# Patient Record
Sex: Female | Born: 1995 | Race: Black or African American | Hispanic: No | Marital: Single | Smoking: Never smoker
Health system: Southern US, Community
[De-identification: ages and names within clinical notes are randomized; demographics above are authoritative.]

## PROBLEM LIST (undated history)

## (undated) DIAGNOSIS — A749 Chlamydial infection, unspecified: Secondary | ICD-10-CM

## (undated) DIAGNOSIS — Z789 Other specified health status: Secondary | ICD-10-CM

## (undated) DIAGNOSIS — G43909 Migraine, unspecified, not intractable, without status migrainosus: Secondary | ICD-10-CM

## (undated) HISTORY — PX: NO PAST SURGERIES: SHX2092

## (undated) HISTORY — DX: Chlamydial infection, unspecified: A74.9

## (undated) HISTORY — DX: Migraine, unspecified, not intractable, without status migrainosus: G43.909

## (undated) HISTORY — PX: REFRACTIVE SURGERY: SHX103

---
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
## 2014-01-03 ENCOUNTER — Ambulatory Visit (INDEPENDENT_AMBULATORY_CARE_PROVIDER_SITE_OTHER): Payer: BC Managed Care – PPO | Admitting: Advanced Practice Midwife

## 2014-01-03 ENCOUNTER — Encounter: Payer: Self-pay | Admitting: Advanced Practice Midwife

## 2014-01-03 VITALS — BP 110/76 | Ht 63.0 in | Wt 104.0 lb

## 2014-01-03 DIAGNOSIS — Z3200 Encounter for pregnancy test, result unknown: Secondary | ICD-10-CM

## 2014-01-03 DIAGNOSIS — Z30017 Encounter for initial prescription of implantable subdermal contraceptive: Secondary | ICD-10-CM | POA: Insufficient documentation

## 2014-01-03 DIAGNOSIS — Z3202 Encounter for pregnancy test, result negative: Secondary | ICD-10-CM

## 2014-01-03 DIAGNOSIS — Z3049 Encounter for surveillance of other contraceptives: Secondary | ICD-10-CM

## 2014-01-03 DIAGNOSIS — Z3046 Encounter for surveillance of implantable subdermal contraceptive: Secondary | ICD-10-CM

## 2014-01-03 LAB — POCT URINE PREGNANCY: Preg Test, Ur: NEGATIVE

## 2014-01-03 NOTE — Progress Notes (Signed)
Karen Moss 18 y.o. Filed Vitals:   01/03/14 1126  BP: 110/76    Past Medical History  Diagnosis Date  . Medical history non-contributory     Family History  Problem Relation Age of Onset  . Hypertension Mother   . Stroke Mother   . Heart disease Mother   . Heart disease Father   . Cancer Maternal Grandmother     lung  . Cancer Maternal Grandfather     brain    History   Social History  . Marital Status: Single    Spouse Name: N/A    Number of Children: N/A  . Years of Education: N/A   Occupational History  . Not on file.   Social History Main Topics  . Smoking status: Never Smoker   . Smokeless tobacco: Never Used  . Alcohol Use: No  . Drug Use: No  . Sexual Activity: Yes    Birth Control/ Protection: Implant   Other Topics Concern  . Not on file   Social History Narrative  . No narrative on file    HPI:  Karen Moss is here for Nexplanon removal and reinsertion.  She was given informed consent for removal of her Nexplanon and reinsertion of a new one.A signed copy is in the chart.  Appropriate time out taken. Nexlanon site (left arm) identified and thea area was prepped in usual sterile fashon. 2 cc of 1% lidocaine was used to anesthetize the area starting with the distal end of the implant. A small stab incision was made right beside the implant on the distal portion.  The Nexplanon rod was grasped using hemostats and removed without difficulty.  There was less than 3 cc blood loss. There were no complications. Next, the area was cleansed again and the new Nexplanon was inserted without difficulty.  Steri strips and a pressure bandage were applied.  Pt was instructed to remove pressure bandage in a few hours, and keep insertion site covered with a bandaid for 3 days.  Follow-up scheduled PRN problems  CRESENZO-DISHMAN,Angelize Ryce 01/03/2014 11:31 AM

## 2014-02-11 ENCOUNTER — Encounter (HOSPITAL_COMMUNITY): Payer: Self-pay | Admitting: *Deleted

## 2014-02-11 ENCOUNTER — Emergency Department (HOSPITAL_COMMUNITY)
Admission: EM | Admit: 2014-02-11 | Discharge: 2014-02-11 | Disposition: A | Discharge status: Home / Self Care | Payer: BC Managed Care – PPO | Attending: Emergency Medicine | Admitting: Emergency Medicine

## 2014-02-11 DIAGNOSIS — R1110 Vomiting, unspecified: Secondary | ICD-10-CM | POA: Diagnosis not present

## 2014-02-11 DIAGNOSIS — R21 Rash and other nonspecific skin eruption: Secondary | ICD-10-CM | POA: Insufficient documentation

## 2014-02-11 DIAGNOSIS — Z79899 Other long term (current) drug therapy: Secondary | ICD-10-CM | POA: Insufficient documentation

## 2014-02-11 DIAGNOSIS — J029 Acute pharyngitis, unspecified: Secondary | ICD-10-CM | POA: Diagnosis present

## 2014-02-11 MED ORDER — AMOXICILLIN 500 MG PO CAPS: 500.0000 mg | ORAL_CAPSULE | Freq: Three times a day (TID) | ORAL | Status: DC

## 2014-02-11 MED ORDER — AMOXICILLIN 250 MG PO CAPS
500.0000 mg | ORAL_CAPSULE | Freq: Once | ORAL | Status: AC
Start: 2014-02-11 — End: 2014-02-11
  Administered 2014-02-11: 500 mg via ORAL
  Filled 2014-02-11: qty 2

## 2014-02-11 MED ORDER — HYDROCODONE-ACETAMINOPHEN 5-325 MG PO TABS: 1.0000 | ORAL_TABLET | ORAL | Status: DC | PRN

## 2014-02-11 MED ORDER — IBUPROFEN 800 MG PO TABS
800.0000 mg | ORAL_TABLET | Freq: Once | ORAL | Status: AC
  Administered 2014-02-11: 800 mg via ORAL
  Filled 2014-02-11: qty 1

## 2014-02-11 MED ORDER — IBUPROFEN 600 MG PO TABS: 600.0000 mg | ORAL_TABLET | Freq: Four times a day (QID) | ORAL | Status: DC | PRN

## 2014-02-11 NOTE — ED Notes (Signed)
Pt states she has had a sore throat since this morning, has had a rash since last week and her pharmacist told her she may have scabies, so she recommend benadryl for her. Pt also vomited x 1 just PTA.

## 2014-02-11 NOTE — ED Provider Notes (Signed)
CSN: [id number]     Arrival date & time 02/11/14  0025 History   First MD Initiated Contact with Patient 02/11/14 0033     No chief complaint on file.    (Consider location/radiation/quality/duration/timing/severity/associated sxs/prior Treatment) Patient is a 18 y.o. female presenting with pharyngitis. The history is provided by the patient.  Sore Throat This is a new problem. The current episode started yesterday. The problem occurs intermittently. The problem has been gradually worsening. Associated symptoms include a rash, a sore throat and vomiting. Pertinent negatives include no abdominal pain, arthralgias, chest pain, coughing or neck pain. The symptoms are aggravated by swallowing. She has tried acetaminophen for the symptoms. The treatment provided no relief.    Past Medical History  Diagnosis Date  . Medical history non-contributory    Past Surgical History  Procedure Laterality Date  . No past surgeries     Family History  Problem Relation Age of Onset  . Hypertension Mother   . Stroke Mother   . Heart disease Mother   . Heart disease Father   . Cancer Maternal Grandmother     lung  . Cancer Maternal Grandfather     brain   History  Substance Use Topics  . Smoking status: Never Smoker   . Smokeless tobacco: Never Used  . Alcohol Use: No   OB History    Gravida Para Term Preterm AB TAB SAB Ectopic Multiple Living   0              Review of Systems  Constitutional: Negative for activity change.       All ROS Neg except as noted in HPI  HENT: Positive for sore throat. Negative for nosebleeds.   Eyes: Negative for photophobia and discharge.  Respiratory: Negative for cough, shortness of breath and wheezing.   Cardiovascular: Negative for chest pain and palpitations.  Gastrointestinal: Positive for vomiting. Negative for abdominal pain and blood in stool.  Genitourinary: Negative for dysuria, frequency and hematuria.  Musculoskeletal: Negative for back pain,  arthralgias and neck pain.  Skin: Positive for rash.  Neurological: Negative for dizziness, seizures and speech difficulty.  Psychiatric/Behavioral: Negative for hallucinations and confusion.      Allergies  Tangerine flavor  Home Medications   Prior to Admission medications   Medication Sig Start Date End Date Taking? Authorizing Provider  citalopram (CELEXA) 10 MG tablet Take 1 tablet (10 mg total) by mouth daily. 08/24/13   Madison Hickman, NP  etonogestrel (IMPLANON) 68 MG IMPL implant Inject 1 each into the skin once.    Historical Provider, MD  hydrOXYzine (ATARAX/VISTARIL) 50 MG tablet Take 1 tablet (50 mg total) by mouth at bedtime as needed for anxiety (sleep/anxiety). 08/24/13   Madison Hickman, NP   There were no vitals taken for this visit. Physical Exam  Constitutional: She is oriented to person, place, and time. She appears well-developed and well-nourished.  Non-toxic appearance.  HENT:  Head: Normocephalic.  Right Ear: Tympanic membrane and external ear normal.  Left Ear: Tympanic membrane and external ear normal.  Mouth/Throat: Uvula is midline. Uvula swelling present. Posterior oropharyngeal erythema present. No tonsillar abscesses.  Eyes: EOM and lids are normal. Pupils are equal, round, and reactive to light.  Neck: Normal range of motion. Neck supple. Carotid bruit is not present.  Cardiovascular: Normal rate, regular rhythm, normal heart sounds, intact distal pulses and normal pulses.   Pulmonary/Chest: Breath sounds normal. No respiratory distress.  Abdominal: Soft. Bowel sounds are normal. There is no  tenderness. There is no guarding.  Musculoskeletal: Normal range of motion.  Lymphadenopathy:       Head (right side): No submandibular adenopathy present.       Head (left side): No submandibular adenopathy present.    She has no cervical adenopathy.  Neurological: She is alert and oriented to person, place, and time. She has normal strength. No cranial  nerve deficit or sensory deficit.  Skin: Skin is warm and dry.  Psychiatric: She has a normal mood and affect. Her speech is normal.  Nursing note and vitals reviewed.   ED Course  Procedures (including critical care time) Labs Review Labs Reviewed - No data to display  Imaging Review No results found.   EKG Interpretation None      MDM Temp 100.1, vital signs otherwise wnl. Rx for amoxil and ibuprofen given to the patient. Rx for norco given to the patient.   Final diagnoses:  None    **I have reviewed nursing notes, vital signs, and all appropriate lab and imaging results for this patient.Lenox Ahr, PA-C 12/87/86 [id number]  Delora Fuel, MD 09/47/09 [id number]

## 2014-02-11 NOTE — Discharge Instructions (Signed)

## 2015-03-09 ENCOUNTER — Other Ambulatory Visit: Payer: Self-pay | Admitting: Obstetrics and Gynecology

## 2015-03-14 ENCOUNTER — Other Ambulatory Visit: Payer: Self-pay | Admitting: *Deleted

## 2015-03-24 IMAGING — US US CHEST/MEDIASTINUM
1 series · 14 of 16 positions shown · non-contrast
Comparison: None.

CLINICAL DATA: Palpable nodule mid back.

CHEST ULTRASOUND

[Series 1: us chest/mediastinum · 0.04mm/px · 14 of 21 slices shown]
[im 1/21]
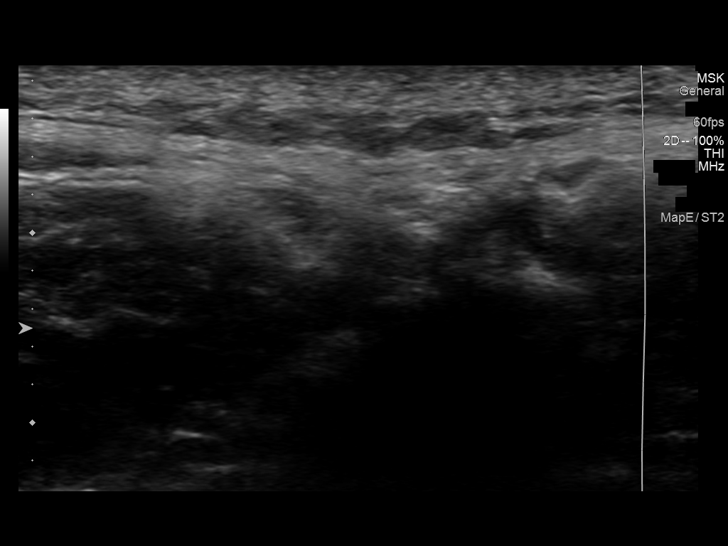
[im 2/21]
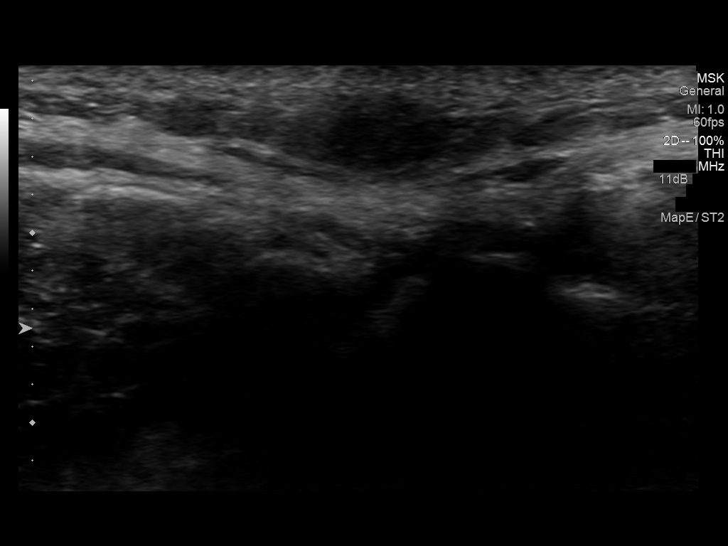
[im 3/21]
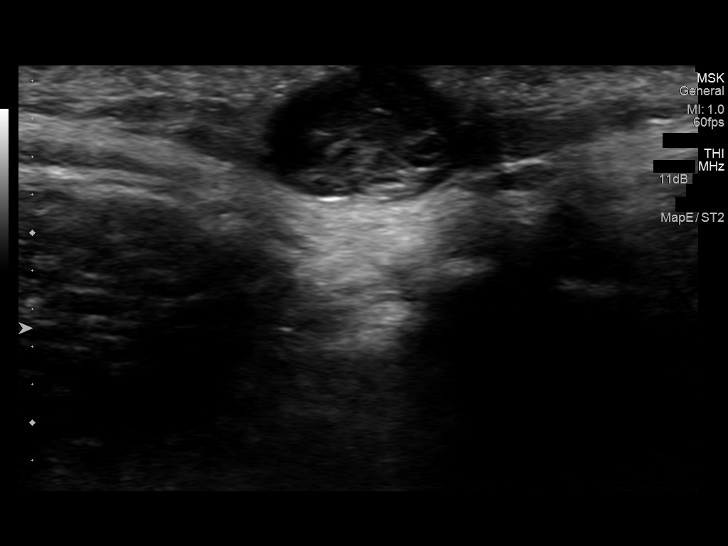
[im 6/21]
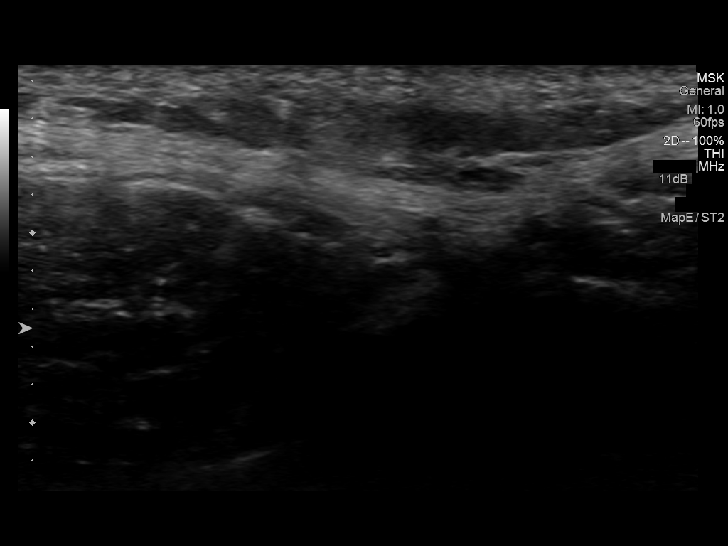
[im 7/21]
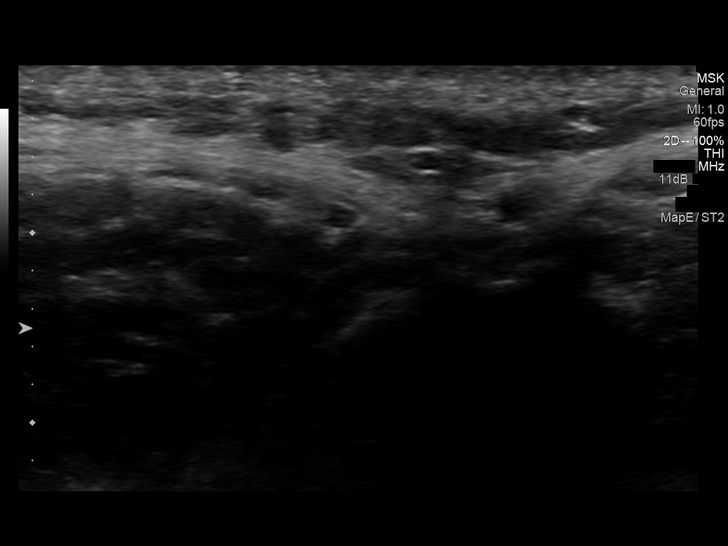
[im 9/21]
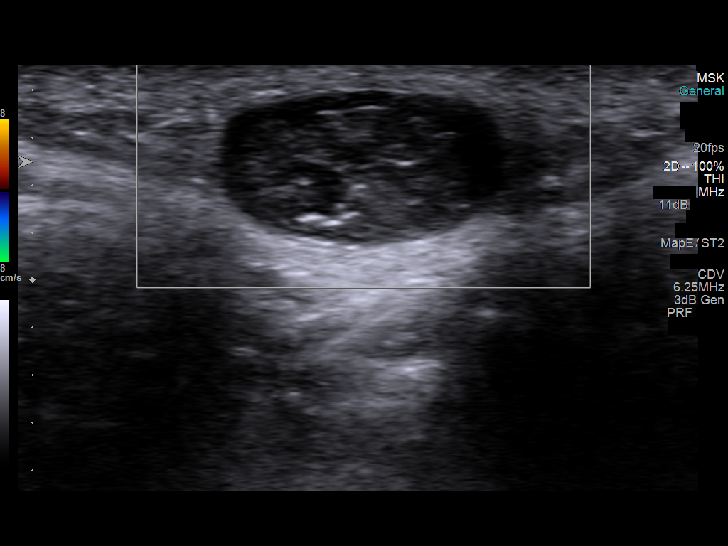
[im 10/21]
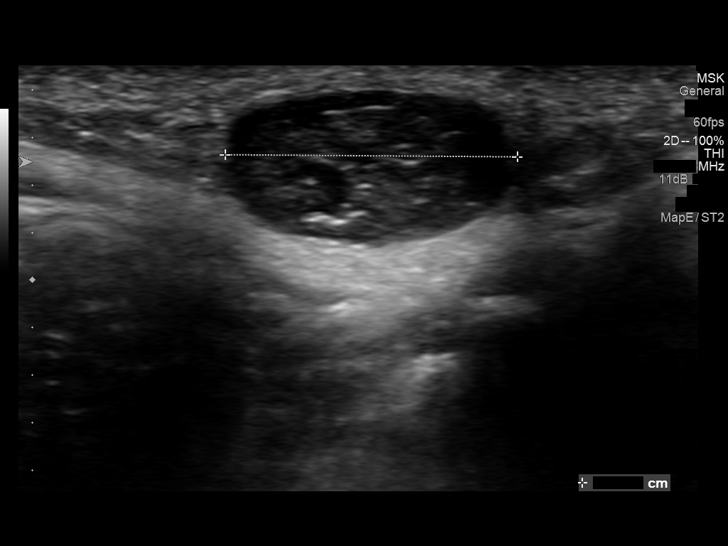
[im 11/21]
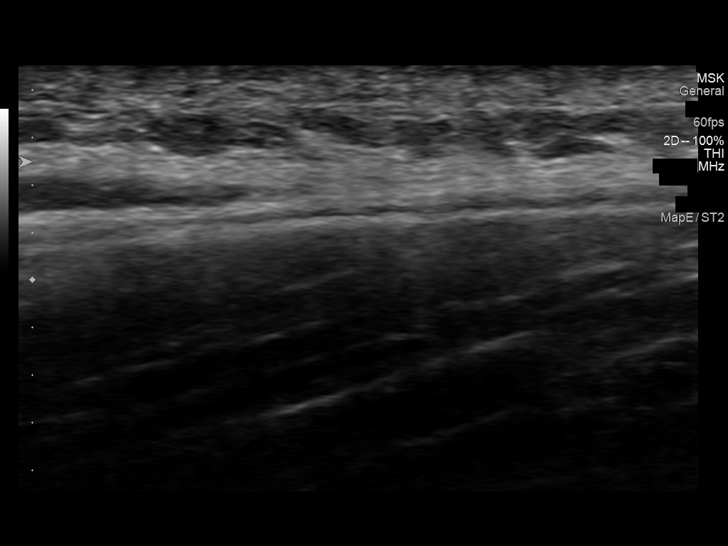
[im 13/21]
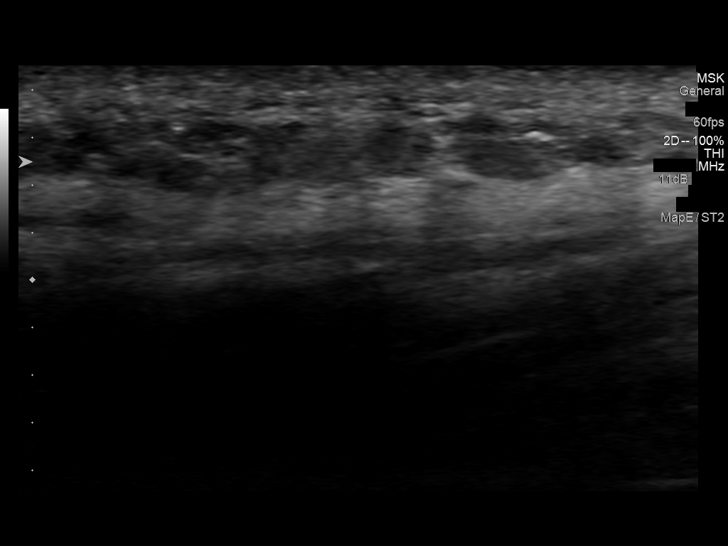
[im 14/21]
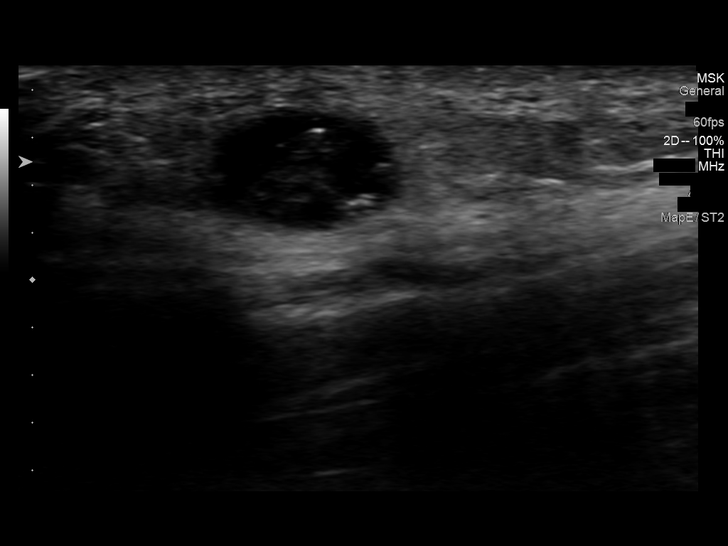
[im 17/21]
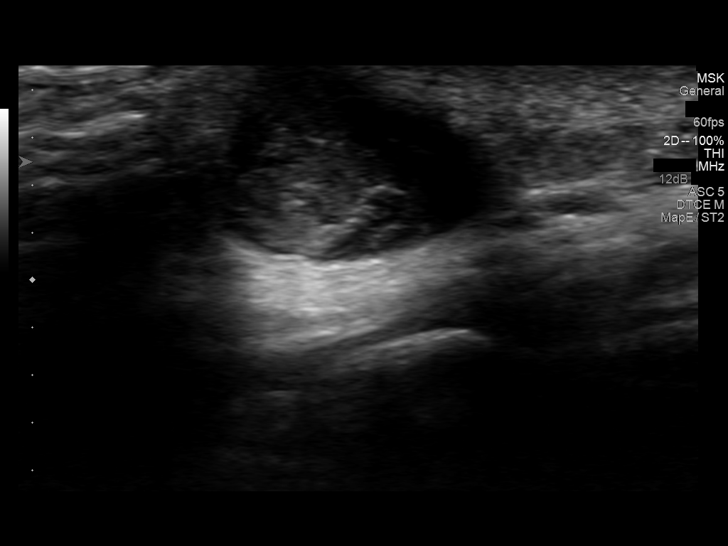
[im 18/21]
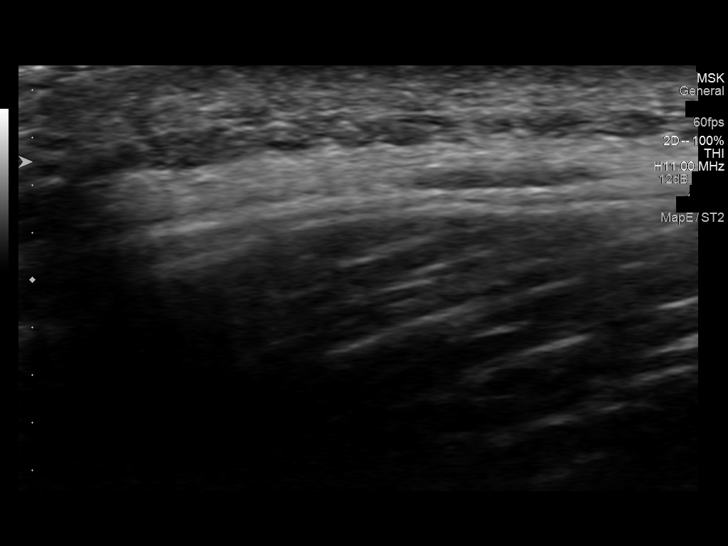
[im 19/21]
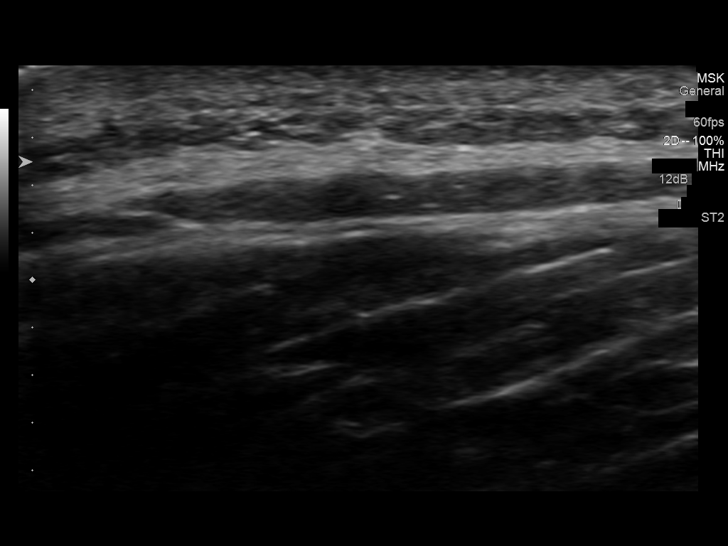
[im 21/21]
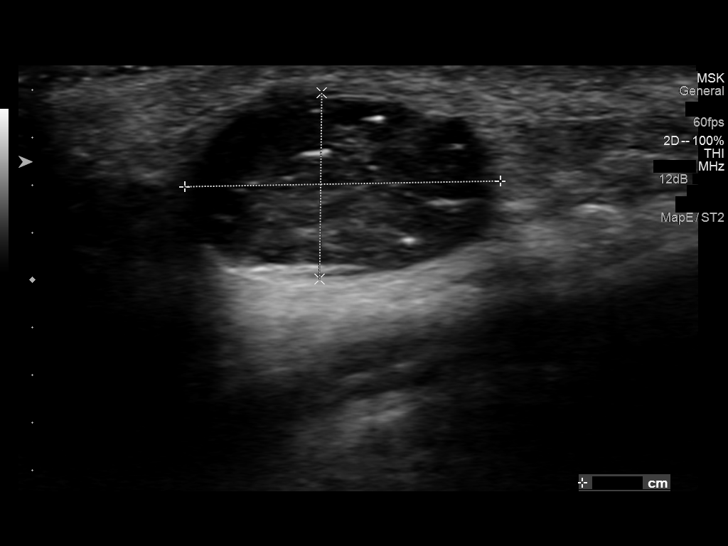

[14 of 16 positions shown; findings below may reference images not displayed]

FINDINGS: Corresponding with the palpable concern reported to be in
the mid central back is a well-circumscribed superficial 1.3 x
x 1.2 cm "mass."  This demonstrates low-level central echoes, but
no internal blood flow with color Doppler.
IMPRESSION: Palpable concern corresponds with a well-circumscribed complex
lesion superficially.  This demonstrates no blood flow and may
reflect a sebaceous cyst. Clinical follow-up recommended.  If there
is clinical concern of this being related to the spine, further
evaluation with MRI should be considered.

## 2015-04-19 ENCOUNTER — Other Ambulatory Visit: Payer: Self-pay | Admitting: Obstetrics and Gynecology

## 2015-04-20 ENCOUNTER — Telehealth: Payer: Self-pay | Admitting: *Deleted

## 2015-04-20 NOTE — Telephone Encounter (Signed)
Pt aware of rx being refilled

## 2016-01-21 ENCOUNTER — Other Ambulatory Visit: Payer: Self-pay | Admitting: Obstetrics & Gynecology

## 2016-11-03 ENCOUNTER — Other Ambulatory Visit: Payer: Self-pay | Admitting: Obstetrics and Gynecology

## 2016-12-18 ENCOUNTER — Other Ambulatory Visit: Payer: Self-pay | Admitting: Advanced Practice Midwife

## 2016-12-23 ENCOUNTER — Encounter: Payer: Self-pay | Admitting: Adult Health

## 2016-12-23 ENCOUNTER — Other Ambulatory Visit (HOSPITAL_COMMUNITY)
Admission: RE | Admit: 2016-12-23 | Discharge: 2016-12-23 | Disposition: A | Discharge status: Home / Self Care | Payer: Self-pay | Source: Ambulatory Visit | Attending: Adult Health | Admitting: Adult Health

## 2016-12-23 ENCOUNTER — Ambulatory Visit (INDEPENDENT_AMBULATORY_CARE_PROVIDER_SITE_OTHER): Payer: Medicaid Other | Admitting: Adult Health

## 2016-12-23 VITALS — BP 100/80 | HR 67 | Ht 61.5 in | Wt 113.0 lb

## 2016-12-23 DIAGNOSIS — Z3009 Encounter for other general counseling and advice on contraception: Secondary | ICD-10-CM

## 2016-12-23 DIAGNOSIS — Z01419 Encounter for gynecological examination (general) (routine) without abnormal findings: Secondary | ICD-10-CM

## 2016-12-23 DIAGNOSIS — Z309 Encounter for contraceptive management, unspecified: Secondary | ICD-10-CM

## 2016-12-23 NOTE — Patient Instructions (Signed)
No sex  Nexplanon 10/23 Physical in 1 year

## 2016-12-23 NOTE — Progress Notes (Signed)
Patient ID: Karen Moss, female   DOB: [birth year], 21 y.o.   MRN: [id number] History of Present Illness:  Karen Moss is a 21 year old black female in for a well woman gyn exam and her first pap.She is working 2 jobs and going to college.   Current Medications, Allergies, Past Medical History, Past Surgical History, Family History and Social History were reviewed in [employer] record.     Review of Systems: Patient denies any headaches, hearing loss, fatigue, blurred vision, shortness of breath, chest pain, abdominal pain, problems with bowel movements, urination, or intercourse. No joint pain or mood swings.Has occasional irregular bleeding with nexplanon.    Physical Exam:BP 100/80 (BP Location: Right Arm, Patient Position: Sitting, Cuff Size: Normal)   Pulse 67   Ht 5' 1.5" (1.562 m)   Wt 113 lb (51.3 kg)   BMI 21.01 kg/m  General:  Well developed, well nourished, no acute distress Skin:  Warm and dry Neck:  Midline trachea, normal thyroid, good ROM, no lymphadenopathy Lungs; Clear to auscultation bilaterally Breast:  No dominant palpable mass, retraction, or nipple discharge,bilateral nipple rods Cardiovascular: Regular rate and rhythm Abdomen:  Soft, non tender, no hepatosplenomegaly,navel pierced  Pelvic:  External genitalia is normal in appearance, no lesions.  The vagina is normal in appearance. Urethra has no lesions or masses. The cervix is smooth. Uterus is felt to be normal size, shape, and contour.  No adnexal masses or tenderness noted.Bladder is non tender, no masses felt. Extremities/musculoskeletal:  No swelling or varicosities noted, no clubbing or cyanosis Psych:  No mood changes, alert and cooperative,seems happy PHQ 2 score 0.  Impression: 1. Encounter for gynecological examination with Papanicolaou smear of cervix   2. Family planning       Plan:  Check HIV and RPR No sex Return 10/23 for nexplanon removal and  reinsertion Physical in 1 year, pap in 3 if normal

## 2016-12-24 LAB — HIV ANTIBODY (ROUTINE TESTING W REFLEX): HIV SCREEN 4TH GENERATION: NONREACTIVE

## 2016-12-24 LAB — RPR: RPR Ser Ql: NONREACTIVE

## 2016-12-26 LAB — CYTOLOGY - PAP
Chlamydia: POSITIVE — AB
Diagnosis: NEGATIVE
Neisseria Gonorrhea: NEGATIVE

## 2016-12-30 ENCOUNTER — Encounter: Payer: Self-pay | Admitting: Adult Health

## 2016-12-30 ENCOUNTER — Telehealth: Payer: Self-pay | Admitting: Adult Health

## 2016-12-30 DIAGNOSIS — A749 Chlamydial infection, unspecified: Secondary | ICD-10-CM

## 2016-12-30 HISTORY — DX: Chlamydial infection, unspecified: A74.9

## 2016-12-30 MED ORDER — AZITHROMYCIN 500 MG PO TABS
ORAL_TABLET | ORAL | 0 refills | Status: DC
Start: 2016-12-30 — End: 2017-01-06

## 2016-12-30 NOTE — Telephone Encounter (Signed)
Pt aware +chlamydia on pap. Rx azithromycin 500 mg # 2 2 po now.No sex. POC 11/14 at 10 am. [facility] sent.Partner to clinic to can treat if desired.

## 2016-12-31 ENCOUNTER — Telehealth: Payer: Self-pay | Admitting: Adult Health

## 2016-12-31 NOTE — Telephone Encounter (Signed)
Pt called and wants her partner Albina Billet [birth year] treated, will send azithromycin 500 mg #2 2 po now to [facility] in [place]

## 2017-01-06 ENCOUNTER — Encounter: Payer: Self-pay | Admitting: Advanced Practice Midwife

## 2017-01-06 ENCOUNTER — Encounter: Payer: Self-pay | Admitting: Adult Health

## 2017-01-06 ENCOUNTER — Ambulatory Visit (INDEPENDENT_AMBULATORY_CARE_PROVIDER_SITE_OTHER): Payer: Medicaid Other | Admitting: Adult Health

## 2017-01-06 VITALS — BP 112/64 | HR 77 | Ht 61.0 in | Wt 118.0 lb

## 2017-01-06 DIAGNOSIS — Z3202 Encounter for pregnancy test, result negative: Secondary | ICD-10-CM | POA: Diagnosis not present

## 2017-01-06 DIAGNOSIS — Z3049 Encounter for surveillance of other contraceptives: Secondary | ICD-10-CM

## 2017-01-06 DIAGNOSIS — Z30017 Encounter for initial prescription of implantable subdermal contraceptive: Secondary | ICD-10-CM

## 2017-01-06 DIAGNOSIS — Z3046 Encounter for surveillance of implantable subdermal contraceptive: Secondary | ICD-10-CM

## 2017-01-06 LAB — POCT URINE PREGNANCY: Preg Test, Ur: NEGATIVE

## 2017-01-06 MED ORDER — ETONOGESTREL 68 MG ~~LOC~~ IMPL
68.0000 mg | DRUG_IMPLANT | Freq: Once | SUBCUTANEOUS | Status: AC
Start: 2017-01-06 — End: 2017-01-06
  Administered 2017-01-06: 68 mg via SUBCUTANEOUS

## 2017-01-06 NOTE — Progress Notes (Signed)
Subjective:     Patient ID: Karen Moss, female   DOB: 13-Mar-1996, 21 y.o.   MRN: [id number]  HPI Karen Moss is a 21 years old black female in for nexplanon removal and reinsertion.  Review of Systems For nexplanon removal and reinsertion Reviewed past medical,surgical, social and family history. Reviewed medications and allergies.     Objective:   Physical Exam BP 112/64 (BP Location: Right Arm, Patient Position: Sitting, Cuff Size: Normal)   Pulse 77   Ht 5\' 1"  (1.549 m)   Wt 118 lb (53.5 kg)   BMI 22.30 kg/m UPT negative.consent signed, time out called,Left arm cleansed with betadine, and injected with 1.5 cc 2% lidocaine and waited til numb.Under sterile technique a #11 blade was used to make small vertical incision, and a curved forceps was used to easily remove rod. Then nexplanon reinserted and easily palpated, by provider and pt, steri strips applied then pressure dressing applied.     Assessment:     1. Encounter for Nexplanon removal   2. Nexplanon insertion   3. Pregnancy examination or test, negative result      lot # [id number] exp 3/21  Plan:     Use condoms x 2 weeks, keep clean and dry x 24 hours, no heavy lifting, keep steri strips on x 72 hours, Keep pressure dressing on x 24 hours. Follow up prn problems.

## 2017-01-06 NOTE — Addendum Note (Signed)
Addended by: Gaylyn Rong A on: 01/06/2017 02:17 PM   Modules accepted: Orders

## 2017-01-06 NOTE — Patient Instructions (Signed)
Use condoms x 2 weeks, keep clean and dry x 24 hours, no heavy lifting, keep steri strips on x 72 hours, Keep pressure dressing on x 24 hours. Follow up prn problems.  

## 2017-01-28 ENCOUNTER — Encounter: Payer: Self-pay | Admitting: Adult Health

## 2017-01-28 ENCOUNTER — Ambulatory Visit (INDEPENDENT_AMBULATORY_CARE_PROVIDER_SITE_OTHER): Payer: Medicaid Other | Admitting: Adult Health

## 2017-01-28 VITALS — BP 126/80 | HR 65 | Ht 62.0 in | Wt 113.0 lb

## 2017-01-28 DIAGNOSIS — Z8619 Personal history of other infectious and parasitic diseases: Secondary | ICD-10-CM | POA: Diagnosis not present

## 2017-01-28 DIAGNOSIS — Z0189 Encounter for other specified special examinations: Secondary | ICD-10-CM

## 2017-01-28 NOTE — Progress Notes (Signed)
Subjective:     Patient ID: Karen Moss, female   DOB: 1995/10/12, 21 y.o.   MRN: [id number]  HPI Karen Moss is a 21 year old black female in for proof of cure for recent +chlamyida on pap.  Review of Systems  Has not had sex, since +CHL  Reviewed past medical,surgical, social and family history. Reviewed medications and allergies.     Objective:   Physical Exam BP 126/80 (BP Location: Right Arm, Patient Position: Sitting, Cuff Size: Normal)   Pulse 65   Ht 5\' 2"  (1.575 m)   Wt 113 lb (51.3 kg)   LMP 01/24/2017   BMI 20.67 kg/m  Skin warm and dry.Pelvic: external genitalia is normal in appearance no lesions, vagina: pinkish tan blood in vault,urethra has no lesions or masses noted, cervix:smooth. GC/CHL obtained.     Assessment:     History of chlamydia, for proof of cure    Plan:     GC/CHL sent F/U prn

## 2017-01-30 LAB — GC/CHLAMYDIA PROBE AMP
CHLAMYDIA, DNA PROBE: NEGATIVE
NEISSERIA GONORRHOEAE BY PCR: NEGATIVE

## 2017-04-23 ENCOUNTER — Other Ambulatory Visit: Payer: Self-pay | Admitting: Obstetrics & Gynecology

## 2017-09-23 ENCOUNTER — Telehealth: Payer: Self-pay | Admitting: Adult Health

## 2017-09-23 MED ORDER — CITALOPRAM HYDROBROMIDE 20 MG PO TABS: 20.0000 mg | ORAL_TABLET | Freq: Every day | ORAL | 2 refills | Status: DC

## 2017-09-23 NOTE — Telephone Encounter (Signed)
Mailbox full, unable to leave VM.

## 2017-09-23 NOTE — Telephone Encounter (Signed)
Pt called requesting rx on celexa, was on years ago when seen at [facility] in [place] seen at [facility] yesterday and and started on trauma therapy and has appt to see psychiatrist tin August but her counselor told her to call us to see if can get back on celexa now.will rx celexa 20 mg 1 daily, #30 with 2 refills

## 2017-09-23 NOTE — Telephone Encounter (Signed)
Patient states she was going to [place] in [place] and is now going to [facility].  She went yesterday for her assessment but will not be seeing a doctor there until August 11.  Needs a refill on her Celexa. Informed patient that she may need a visit with [facility] since she has not been seen since 2018. Verbalized understanding.  Please advise.

## 2017-10-22 ENCOUNTER — Other Ambulatory Visit: Payer: Self-pay | Admitting: Obstetrics and Gynecology

## 2017-12-28 ENCOUNTER — Telehealth: Payer: Self-pay | Admitting: *Deleted

## 2017-12-28 NOTE — Telephone Encounter (Signed)
Patient states she has been seeing a psychiatrist who has prescribed her antidepressants which have caused her to have a decrease in appetite.  States she can goes days without eating and was told by her psychiatrist that the Megace could be increased to help with her appetite.  Can you please advise.

## 2017-12-28 NOTE — Telephone Encounter (Signed)
LMOVM returning patient's call.  

## 2018-02-16 ENCOUNTER — Encounter: Payer: Self-pay | Admitting: Adult Health

## 2018-02-16 ENCOUNTER — Ambulatory Visit (INDEPENDENT_AMBULATORY_CARE_PROVIDER_SITE_OTHER): Payer: Medicaid Other | Admitting: Adult Health

## 2018-02-16 VITALS — BP 121/71 | HR 74 | Ht 62.0 in | Wt 110.0 lb

## 2018-02-16 DIAGNOSIS — H6120 Impacted cerumen, unspecified ear: Secondary | ICD-10-CM

## 2018-02-16 DIAGNOSIS — Z309 Encounter for contraceptive management, unspecified: Secondary | ICD-10-CM | POA: Diagnosis not present

## 2018-02-16 DIAGNOSIS — Z01419 Encounter for gynecological examination (general) (routine) without abnormal findings: Secondary | ICD-10-CM

## 2018-02-16 DIAGNOSIS — Z113 Encounter for screening for infections with a predominantly sexual mode of transmission: Secondary | ICD-10-CM

## 2018-02-16 DIAGNOSIS — Z3009 Encounter for other general counseling and advice on contraception: Secondary | ICD-10-CM | POA: Insufficient documentation

## 2018-02-16 DIAGNOSIS — Z975 Presence of (intrauterine) contraceptive device: Secondary | ICD-10-CM | POA: Diagnosis not present

## 2018-02-16 HISTORY — DX: Impacted cerumen, unspecified ear: H61.20

## 2018-02-16 NOTE — Progress Notes (Signed)
Patient ID: Karen Moss, female   DOB: 03/28/1995, 22 y.o.   MRN: [id number] History of Present Illness: Karen Moss is a 22 year old black female in for well woman gyn exam, she had normal pap 12/23/16. 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  PCP is [employer] PA.   Current Medications, Allergies, Past Medical History, Past Surgical History, Family History and Social History were reviewed in [employer] record.     Review of Systems: Patient denies any headaches, hearing loss, fatigue, blurred vision, shortness of breath, chest pain, abdominal pain, problems with bowel movements, urination, or intercourse. No joint pain or mood swings. Ears feel full, popping esp on left.  Feels tender over nexplanon at times.  Occasional irregular bleeding  with nexplanon   Physical Exam:BP 121/71 (BP Location: Right Arm, Patient Position: Sitting, Cuff Size: Normal)   Pulse 74   Ht 5\' 2"  (1.575 m)   Wt 110 lb (49.9 kg)   BMI 20.12 kg/m  General:  Well developed, well nourished, no acute distress Skin:  Warm and dry,has bilateral ear wax Neck:  Midline trachea, normal thyroid, good ROM, no lymphadenopathy Lungs; Clear to auscultation bilaterally Breast:  No dominant palpable mass, retraction, or nipple discharge Cardiovascular: Regular rate and rhythm Abdomen:  Soft, non tender, no hepatosplenomegaly Pelvic:  External genitalia is normal in appearance, no lesions.  The vagina is normal in appearance. Urethra has no lesions or masses. The cervix is smooth and nulliparous, GC/CHL obtained.Marland Kitchen  Uterus is felt to be normal size, shape, and contour.  No adnexal masses or tenderness noted.Bladder is non tender, no masses felt. Extremities/musculoskeletal:  No swelling or varicosities noted, no clubbing or cyanosis. Nexplanon easily palpated and slightly tender, no redness or swelling.  Psych:  No mood changes, alert and cooperative,seems happy PHQ 2 score 0. Fall risk is  low. Examination chaperoned by Genia Hotter RN.  Impression: 1. Encounter for well woman exam with routine gynecological exam   2. Family planning   3. Screening examination for STD (sexually transmitted disease)   4. Nexplanon in place   5. Wax in ear       Plan: Use warm water or Debrox for ear wax, no Q tips GC/CHL sent Check HIV and RPR Physical in 1 year Pap in 2021

## 2018-02-17 LAB — HIV ANTIBODY (ROUTINE TESTING W REFLEX): HIV SCREEN 4TH GENERATION: NONREACTIVE

## 2018-02-17 LAB — RPR: RPR Ser Ql: NONREACTIVE

## 2018-02-18 LAB — GC/CHLAMYDIA PROBE AMP
CHLAMYDIA, DNA PROBE: NEGATIVE
Neisseria gonorrhoeae by PCR: NEGATIVE

## 2018-02-23 ENCOUNTER — Other Ambulatory Visit: Payer: Self-pay | Admitting: Obstetrics and Gynecology

## 2018-06-05 ENCOUNTER — Other Ambulatory Visit: Payer: Self-pay | Admitting: Obstetrics & Gynecology

## 2018-08-16 ENCOUNTER — Other Ambulatory Visit: Payer: Self-pay

## 2018-08-16 ENCOUNTER — Encounter (HOSPITAL_COMMUNITY): Payer: Self-pay | Admitting: *Deleted

## 2018-08-16 ENCOUNTER — Emergency Department (HOSPITAL_COMMUNITY)
Admission: EM | Admit: 2018-08-16 | Discharge: 2018-08-16 | Disposition: A | Discharge status: Home / Self Care | Payer: PRIVATE HEALTH INSURANCE | Attending: Emergency Medicine | Admitting: Emergency Medicine

## 2018-08-16 DIAGNOSIS — Z5320 Procedure and treatment not carried out because of patient's decision for unspecified reasons: Secondary | ICD-10-CM | POA: Insufficient documentation

## 2018-08-16 DIAGNOSIS — Y92239 Unspecified place in hospital as the place of occurrence of the external cause: Secondary | ICD-10-CM | POA: Insufficient documentation

## 2018-08-16 DIAGNOSIS — Z79899 Other long term (current) drug therapy: Secondary | ICD-10-CM | POA: Diagnosis not present

## 2018-08-16 DIAGNOSIS — Y990 Civilian activity done for income or pay: Secondary | ICD-10-CM | POA: Diagnosis not present

## 2018-08-16 DIAGNOSIS — Z77098 Contact with and (suspected) exposure to other hazardous, chiefly nonmedicinal, chemicals: Secondary | ICD-10-CM

## 2018-08-16 DIAGNOSIS — Y9389 Activity, other specified: Secondary | ICD-10-CM | POA: Diagnosis not present

## 2018-08-16 DIAGNOSIS — H5713 Ocular pain, bilateral: Secondary | ICD-10-CM | POA: Diagnosis present

## 2018-08-16 NOTE — ED Triage Notes (Signed)
PT requested to leave because [facility] visitors are limited and her mother can not visit. PA explained to PT the limits of visitors due to [place] -19.

## 2018-08-16 NOTE — ED Notes (Addendum)
Pt called out requesting the morgan lenses be removed from her eye. Pt stated "I want all care stopped and I am going home." This RN asked why she was leaving and pt stated "Ya'll won't let my mother back so I am leaving!" Explained that visitors are only allowed in certain circumstances per [facility] and pt replied "My floor allows visitors to visit with patients." This RN explained that visitors are allowed with altered mental status patients, minors, and those who are actively dying. Pt then replied "My supervisor stated my mother can visit with me in the ED and my floor allows all patients to have visitors." This RN left the room at this point and advised notification to her nurse and provider would be made.

## 2018-08-16 NOTE — ED Triage Notes (Signed)
PT reports the Peri -care  Splashed in her eye. Pt reports burning to face Rt/Lt.  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 Pt reports blurred vision.

## 2018-08-16 NOTE — ED Triage Notes (Signed)
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 Pt requested her Mother to come to room . Pt was informed [place] COVID -19  No visitor policy . Pt reported her supervisor told her her mother could come back. Pt informed of [facility] policy of no visitors.

## 2018-08-16 NOTE — ED Provider Notes (Signed)
[facility] EMERGENCY DEPARTMENT Provider Note   CSN: [id number] Arrival date & time: 08/16/18  2000    History   Chief Complaint Chief Complaint  Patient presents with  . Chemical Exposure    in eyes    HPI Karen Moss is a 23 y.o. female no significant past medical history, presenting to the emergency department with bilateral eye pain, worse in the right.  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  She states she had the peri-spray splashed in her eyes.  Her eyes are burning and she has some blurry vision, however she states most of that is due to the fact that she pulled her contacts out and now cannot see very well.  She irrigated her eyes with the eyewash station for a few minutes prior to coming down to the ED.  No other symptoms reported.     The history is provided by the patient.    Past Medical History:  Diagnosis Date  . Chlamydia 12/30/2016  . Medical history non-contributory     Patient Active Problem List   Diagnosis Date Noted  . Encounter for well woman exam with routine gynecological exam 02/16/2018  . Family planning 02/16/2018  . Screening examination for STD (sexually transmitted disease) 02/16/2018  . Nexplanon in place 02/16/2018  . Wax in ear 02/16/2018  . Chlamydia 12/30/2016  . Nexplanon insertion 01/03/2014  . MDD (major depressive disorder), single episode, moderate ([place]) 08/21/2013    Past Surgical History:  Procedure Laterality Date  . NO PAST SURGERIES       OB History    Gravida  0   Para      Term      Preterm      AB      Living        SAB      TAB      Ectopic      Multiple      Live Births               Home Medications    Prior to Admission medications   Medication Sig Start Date End Date Taking? Authorizing Provider  citalopram (CELEXA) 20 MG tablet Take 1 tablet (20 mg total) by mouth daily. 09/23/17   Estill Dooms, NP  etonogestrel (IMPLANON) 68 MG  IMPL implant Inject 1 each into the skin once.    [provider]  megestrol (MEGACE) 40 MG tablet TAKE 1 TABLET BY MOUTH ONCE DAILY OF  SPOTTING 02/23/18   Jonnie Kind, MD  traZODone (DESYREL) 100 MG tablet TAKE 1 TABLET BY MOUTH ONCE DAILY AFTER A MEAL 01/08/18   [provider]  valACYclovir (VALTREX) 1000 MG tablet Take 1 tablet by mouth once daily 06/07/18   Florian Buff, MD    Family History Family History  Problem Relation Age of Onset  . Hypertension Mother   . Stroke Mother   . Heart disease Father   . Cancer Father 66       prostate  . Cancer Maternal Grandmother        lung  . Cancer Maternal Grandfather        brain    Social History Social History   Tobacco Use  . Smoking status: Never Smoker  . Smokeless tobacco: Never Used  Substance Use Topics  . Alcohol use: Yes    Comment: occ  . Drug use: No     Allergies  Tangerine flavor   Review of Systems Review of Systems  Eyes: Positive for pain and visual disturbance.  All other systems reviewed and are negative.    Physical Exam Updated Vital Signs BP 126/71 (BP Location: Right Arm)   Pulse 79   Temp 98.4 F (36.9 C) (Oral)   Ht 5\' 1"  (1.549 m)   Wt 51.7 kg   LMP 08/16/2018   SpO2 100%   BMI 21.54 kg/m   Physical Exam Vitals signs and nursing note reviewed.  Constitutional:      Appearance: She is well-developed.  HENT:     Head: Normocephalic and atraumatic.  Eyes:     Comments: Conjunctiva is to injected bilaterally.  No swelling to the face or lids.  Cardiovascular:     Rate and Rhythm: Normal rate.  Pulmonary:     Effort: Pulmonary effort is normal.  Neurological:     Mental Status: She is alert.  Psychiatric:        Mood and Affect: Mood normal.        Behavior: Behavior normal.      ED Treatments / Results  Labs (all labs ordered are listed, but only abnormal results are displayed) Labs Reviewed - No data to display  EKG None  Radiology No  results found.  Procedures Procedures (including critical care time)  Medications Ordered in ED Medications - No data to display   Initial Impression / Assessment and Plan / ED Course  I have reviewed the triage vital signs and the nursing notes.  Pertinent labs & imaging results that were available during my care of the patient were reviewed by me and considered in my medical decision making (see chart for details).        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 In the ED, eyes were irrigated with a Morgan lens and this was in progress upon my initial evaluation. However after about 500 cc of irrigation, patient became upset with the current visitor policy in the setting of the current COVID-19 pandemic.  Nursing discussed with patient that her mother would be unable to come back into her room.  Is reported she became upset and requesting to leave AMA, removing the [facility] lens irrigation.  I was unable to fully examine this patient, and she declined further evaluation.  I discussed the risks of leaving [place] prior to full examination, to include permanent eye damage or vision loss.  Patient verbalized understanding of these risks and continues to request to leave the ED [place].  Dr. Vanita Panda made aware.  Final Clinical Impressions(s) / ED Diagnoses   Final diagnoses:  Chemical exposure of eye    ED Discharge Orders    None       , [place] N, PA-C 08/16/18 2116    Carmin Muskrat, MD 08/18/18 1642

## 2018-09-04 ENCOUNTER — Emergency Department (HOSPITAL_COMMUNITY)
Admission: EM | Admit: 2018-09-04 | Discharge: 2018-09-04 | Disposition: A | Discharge status: Home / Self Care | Payer: Medicaid Other | Attending: Emergency Medicine | Admitting: Emergency Medicine

## 2018-09-04 ENCOUNTER — Encounter (HOSPITAL_COMMUNITY): Payer: Self-pay | Admitting: Emergency Medicine

## 2018-09-04 ENCOUNTER — Other Ambulatory Visit: Payer: Self-pay

## 2018-09-04 DIAGNOSIS — J358 Other chronic diseases of tonsils and adenoids: Secondary | ICD-10-CM

## 2018-09-04 DIAGNOSIS — H9201 Otalgia, right ear: Secondary | ICD-10-CM | POA: Insufficient documentation

## 2018-09-04 DIAGNOSIS — Z79899 Other long term (current) drug therapy: Secondary | ICD-10-CM | POA: Insufficient documentation

## 2018-09-04 DIAGNOSIS — J029 Acute pharyngitis, unspecified: Secondary | ICD-10-CM | POA: Insufficient documentation

## 2018-09-04 LAB — GROUP A STREP BY PCR: Group A Strep by PCR: NOT DETECTED

## 2018-09-04 MED ORDER — ACETIC ACID 2 % OT SOLN: 4.0000 [drp] | Freq: Three times a day (TID) | OTIC | 0 refills | Status: DC | PRN

## 2018-09-04 MED ORDER — PHENOL 1.4 % MT LIQD: 2.0000 | OROMUCOSAL | 0 refills | Status: DC | PRN

## 2018-09-04 MED ORDER — DEXAMETHASONE SODIUM PHOSPHATE 10 MG/ML IJ SOLN
10.0000 mg | Freq: Once | INTRAMUSCULAR | Status: AC
  Administered 2018-09-04: 10 mg via INTRAMUSCULAR
  Filled 2018-09-04: qty 1

## 2018-09-04 NOTE — Discharge Instructions (Addendum)
Your strep test today was negative.  Apply eardrops as needed for ear pain up to 3 times daily.  Drink plenty water and get plenty of rest.  You can take 400 to 600 mg of ibuprofen every 6 hours as needed for pain.  If this medicine starts to wear off before the next dose, you can take 500-1,000 mg of Tylenol every 6 hours.  Use warm water salt gargles, warm teas, honey, throat lozenges for management of sore throat.  He can also use Chloraseptic spray to help numb the back of the throat for comfort.  Return to the emergency department if any concerning signs or symptoms develop such as difficulty breathing, drooling, voice change, or high fevers.  Follow-up with PCP if symptoms persist.

## 2018-09-04 NOTE — ED Triage Notes (Signed)
Pt c/o sore throat with white spots since Thursday. Denies fever.

## 2018-09-04 NOTE — ED Provider Notes (Signed)
[facility] EMERGENCY DEPARTMENT Provider Note   CSN: [id number] Arrival date & time: 09/04/18  1655    History   Chief Complaint Chief Complaint  Patient presents with  . Sore Throat    HPI Karen Moss is a 23 y.o. female presents for evaluation of acute onset, progressively worsening right ear pain and sore throat.  She reports that her symptoms began with right ear pain on Wednesday 4 days ago.  Shortly thereafter she began to feel soreness in her throat which worsens with swallowing.  Denies drooling or difficulty breathing.  No fever or nasal congestion.  Has tried warm water salt gargles without significant relief of symptoms. Looked in her throat and noticed a white spot on tonsils.      The history is provided by the patient.    Past Medical History:  Diagnosis Date  . Chlamydia 12/30/2016  . Medical history non-contributory     Patient Active Problem List   Diagnosis Date Noted  . Encounter for well woman exam with routine gynecological exam 02/16/2018  . Family planning 02/16/2018  . Screening examination for STD (sexually transmitted disease) 02/16/2018  . Nexplanon in place 02/16/2018  . Wax in ear 02/16/2018  . Chlamydia 12/30/2016  . Nexplanon insertion 01/03/2014  . MDD (major depressive disorder), single episode, moderate ([place]) 08/21/2013    Past Surgical History:  Procedure Laterality Date  . NO PAST SURGERIES       OB History    Gravida  0   Para      Term      Preterm      AB      Living        SAB      TAB      Ectopic      Multiple      Live Births               Home Medications    Prior to Admission medications   Medication Sig Start Date End Date Taking? Authorizing Provider  acetic acid 2 % otic solution Place 4 drops into the right ear 3 (three) times daily as needed (ear pain). 09/04/18   Nils Flack, Katriana Dortch A, PA-C  citalopram (CELEXA) 20 MG tablet Take 1 tablet (20 mg total) by mouth daily. 09/23/17   Estill Dooms, NP  etonogestrel (IMPLANON) 68 MG IMPL implant Inject 1 each into the skin once.    [provider]  megestrol (MEGACE) 40 MG tablet TAKE 1 TABLET BY MOUTH ONCE DAILY OF  SPOTTING 02/23/18   Jonnie Kind, MD  phenol (CHLORASEPTIC) 1.4 % LIQD Use as directed 2 sprays in the mouth or throat as needed for throat irritation / pain. 09/04/18   Solace Manwarren A, PA-C  traZODone (DESYREL) 100 MG tablet TAKE 1 TABLET BY MOUTH ONCE DAILY AFTER A MEAL 01/08/18   [provider]  valACYclovir (VALTREX) 1000 MG tablet Take 1 tablet by mouth once daily 06/07/18   Florian Buff, MD    Family History Family History  Problem Relation Age of Onset  . Hypertension Mother   . Stroke Mother   . Heart disease Father   . Cancer Father 16       prostate  . Cancer Maternal Grandmother        lung  . Cancer Maternal Grandfather        brain    Social History Social History   Tobacco Use  . Smoking  status: Never Smoker  . Smokeless tobacco: Never Used  Substance Use Topics  . Alcohol use: Yes    Comment: occ  . Drug use: No     Allergies   Tangerine flavor   Review of Systems Review of Systems  Constitutional: Negative for chills and fever.  HENT: Positive for ear pain and sore throat. Negative for ear discharge, trouble swallowing and voice change.   Respiratory: Negative for cough and shortness of breath.   Cardiovascular: Negative for chest pain.     Physical Exam Updated Vital Signs BP 129/81 (BP Location: Right Arm)   Pulse 89   Temp 98.2 F (36.8 C) (Oral)   Ht 5\' 2"  (1.575 m)   Wt 51.7 kg   LMP 08/16/2018   SpO2 99%   BMI 20.85 kg/m   Physical Exam Vitals signs and nursing note reviewed.  Constitutional:      General: She is not in acute distress.    Appearance: She is well-developed.     Comments: Resting comfortably in chair  HENT:     Head: Normocephalic and atraumatic.     Right Ear: A middle ear effusion is present. Tympanic  membrane is not erythematous.     Left Ear: Tympanic membrane and ear canal normal. No drainage.  No middle ear effusion. Tympanic membrane is not erythematous.     Ears:     Comments: No tenderness to palpation of the pinna.  No mastoid tenderness.  Mild pain on insertion of the speculum into the ear canal.  No drainage or swelling noted.    Mouth/Throat:     Mouth: Mucous membranes are moist.     Tonsils: 2+ on the right. 2+ on the left.     Comments: Mild tonsillar hypertrophy and erythema.  Uvula is midline.  Airways patent, patient tolerating secretions without difficulty.  She has a single tonsil stone on the superior pole of the right tonsil.  No sublingual abnormalities. Eyes:     General:        Right eye: No discharge.        Left eye: No discharge.     Conjunctiva/sclera: Conjunctivae normal.  Neck:     Musculoskeletal: Normal range of motion and neck supple.     Vascular: No JVD.     Trachea: No tracheal deviation.     Comments: Right anterior cervical lymphadenopathy Cardiovascular:     Rate and Rhythm: Normal rate.  Pulmonary:     Effort: Pulmonary effort is normal.  Abdominal:     General: Bowel sounds are normal. There is no distension.     Palpations: Abdomen is soft.  Lymphadenopathy:     Cervical: Cervical adenopathy present.  Skin:    General: Skin is warm and dry.     Findings: No erythema.  Neurological:     Mental Status: She is alert.  Psychiatric:        Behavior: Behavior normal.      ED Treatments / Results  Labs (all labs ordered are listed, but only abnormal results are displayed) Labs Reviewed  GROUP A STREP BY PCR    EKG None  Radiology No results found.  Procedures Procedures (including critical care time)  Medications Ordered in ED Medications  dexamethasone (DECADRON) injection 10 mg (10 mg Intramuscular Given 09/04/18 1754)     Initial Impression / Assessment and Plan / ED Course  I have reviewed the triage vital signs and  the nursing notes.  Pertinent labs &  imaging results that were available during my care of the patient were reviewed by me and considered in my medical decision making (see chart for details).        Patient with right ear pain and sore throat.  She is afebrile, vital signs are stable.  She is nontoxic in appearance.  Tolerating secretions without difficulty.  Airway appears patent.  No concern for Ludwig's angina, deep space neck infection, retropharyngeal abscess, or peritonsillar abscess.  Rapid strep test was negative.  She has a tonsil stone but no exudates on the tonsils.  Examination of the ear does not suggest otitis media, malignant otitis externa, or mastoiditis but she may have a mild otitis externa.  Given Decadron in the ED for sore throat.  Discussed symptomatic management at home.  Recommend follow-up with PCP if symptoms persist.  Discussed strict ED return precautions. Patient verbalized understanding of and agreement with plan and is safe for discharge home at this time.   Final Clinical Impressions(s) / ED Diagnoses   Final diagnoses:  Viral pharyngitis  Right ear pain  Tonsil stone    ED Discharge Orders         Ordered    phenol (CHLORASEPTIC) 1.4 % LIQD  As needed     09/04/18 1755    acetic acid 2 % otic solution  3 times daily PRN     09/04/18 1755           Renita Papa, PA-C 09/04/18 1800    Fredia Sorrow, MD 09/04/18 [phone]

## 2018-11-15 ENCOUNTER — Other Ambulatory Visit: Payer: Self-pay | Admitting: Obstetrics and Gynecology

## 2019-02-21 ENCOUNTER — Ambulatory Visit: Payer: Medicaid Other | Admitting: Obstetrics & Gynecology

## 2019-02-21 ENCOUNTER — Other Ambulatory Visit: Payer: Self-pay

## 2019-02-21 ENCOUNTER — Encounter: Payer: Self-pay | Admitting: Obstetrics & Gynecology

## 2019-02-21 ENCOUNTER — Other Ambulatory Visit (HOSPITAL_COMMUNITY)
Admission: RE | Admit: 2019-02-21 | Discharge: 2019-02-21 | Disposition: A | Discharge status: Home / Self Care | Payer: Medicaid Other | Source: Ambulatory Visit | Attending: Adult Health | Admitting: Adult Health

## 2019-02-21 VITALS — BP 111/74 | HR 69 | Ht 62.0 in | Wt 118.3 lb

## 2019-02-21 DIAGNOSIS — Z01419 Encounter for gynecological examination (general) (routine) without abnormal findings: Secondary | ICD-10-CM

## 2019-02-21 DIAGNOSIS — Z113 Encounter for screening for infections with a predominantly sexual mode of transmission: Secondary | ICD-10-CM

## 2019-02-21 DIAGNOSIS — A749 Chlamydial infection, unspecified: Secondary | ICD-10-CM | POA: Diagnosis not present

## 2019-02-21 DIAGNOSIS — Z01411 Encounter for gynecological examination (general) (routine) with abnormal findings: Secondary | ICD-10-CM | POA: Diagnosis not present

## 2019-02-21 NOTE — Progress Notes (Signed)
Subjective:     Karen Moss is a 23 y.o. female here for a routine exam.  Patient's last menstrual period was 02/15/2019. G0P0 Birth Control Method:  nexplanon Menstrual Calendar(currently): regular every 2-3 months Current complaints: prolonged bleeding .   Current acute medical issues:     Recent Gynecologic History Patient's last menstrual period was 02/15/2019. Last Pap: 2019,  normal Last mammogram: ,    Past Medical History:  Diagnosis Date  . Chlamydia 12/30/2016  . Medical history non-contributory     Past Surgical History:  Procedure Laterality Date  . NO PAST SURGERIES      OB History    Gravida  0   Para      Term      Preterm      AB      Living        SAB      TAB      Ectopic      Multiple      Live Births              Social History   Socioeconomic History  . Marital status: Single    Spouse name: Not on file  . Number of children: Not on file  . Years of education: Not on file  . Highest education level: Not on file  Occupational History  . Not on file  Social Needs  . Financial resource strain: Not on file  . Food insecurity    Worry: Not on file    Inability: Not on file  . Transportation needs    Medical: Not on file    Non-medical: Not on file  Tobacco Use  . Smoking status: Never Smoker  . Smokeless tobacco: Never Used  Substance and Sexual Activity  . Alcohol use: Yes    Comment: occ  . Drug use: No  . Sexual activity: Yes    Birth control/protection: Implant  Lifestyle  . Physical activity    Days per week: Not on file    Minutes per session: Not on file  . Stress: Not on file  Relationships  . Social Herbalist on phone: Not on file    Gets together: Not on file    Attends religious service: Not on file    Active member of club or organization: Not on file    Attends meetings of clubs or organizations: Not on file    Relationship status: Not on file  Other Topics Concern  . Not on  file  Social History Narrative  . Not on file    Family History  Problem Relation Age of Onset  . Hypertension Mother   . Stroke Mother   . Heart disease Father   . Cancer Father 48       prostate  . Cancer Maternal Grandmother        lung  . Cancer Maternal Grandfather        brain     Current Outpatient Medications:  .  citalopram (CELEXA) 20 MG tablet, Take 1 tablet (20 mg total) by mouth daily., Disp: 30 tablet, Rfl: 2 .  etonogestrel (IMPLANON) 68 MG IMPL implant, Inject 1 each into the skin once., Disp: , Rfl:  .  megestrol (MEGACE) 40 MG tablet, TAKE 1 TABLET BY MOUTH ONCE DAILY OF  SPOTTING, Disp: 30 tablet, Rfl: 0 .  phenol (CHLORASEPTIC) 1.4 % LIQD, Use as directed 2 sprays in the mouth or throat as needed  for throat irritation / pain., Disp: 29 mL, Rfl: 0 .  traZODone (DESYREL) 100 MG tablet, TAKE 1 TABLET BY MOUTH ONCE DAILY AFTER A MEAL, Disp: , Rfl: 3 .  valACYclovir (VALTREX) 1000 MG tablet, Take 1 tablet by mouth once daily, Disp: 30 tablet, Rfl: 2 .  acetic acid 2 % otic solution, Place 4 drops into the right ear 3 (three) times daily as needed (ear pain). (Patient not taking: Reported on 02/21/2019), Disp: 15 mL, Rfl: 0  Review of Systems  Review of Systems  Constitutional: Negative for fever, chills, weight loss, malaise/fatigue and diaphoresis.  HENT: Negative for hearing loss, ear pain, nosebleeds, congestion, sore throat, neck pain, tinnitus and ear discharge.   Eyes: Negative for blurred vision, double vision, photophobia, pain, discharge and redness.  Respiratory: Negative for cough, hemoptysis, sputum production, shortness of breath, wheezing and stridor.   Cardiovascular: Negative for chest pain, palpitations, orthopnea, claudication, leg swelling and PND.  Gastrointestinal: negative for abdominal pain. Negative for heartburn, nausea, vomiting, diarrhea, constipation, blood in stool and melena.  Genitourinary: Negative for dysuria, urgency, frequency,  hematuria and flank pain.  Musculoskeletal: Negative for myalgias, back pain, joint pain and falls.  Skin: Negative for itching and rash.  Neurological: Negative for dizziness, tingling, tremors, sensory change, speech change, focal weakness, seizures, loss of consciousness, weakness and headaches.  Endo/Heme/Allergies: Negative for environmental allergies and polydipsia. Does not bruise/bleed easily.  Psychiatric/Behavioral: Negative for depression, suicidal ideas, hallucinations, memory loss and substance abuse. The patient is not nervous/anxious and does not have insomnia.        Objective:  Blood pressure 111/74, pulse 69, height 5\' 2"  (1.575 m), weight 118 lb 4.8 oz (53.7 kg), last menstrual period 02/15/2019.   Physical Exam  Vitals reviewed. Constitutional: She is oriented to person, place, and time. She appears well-developed and well-nourished.  HENT:  Head: Normocephalic and atraumatic.        Right Ear: External ear normal.  Left Ear: External ear normal.  Nose: Nose normal.  Mouth/Throat: Oropharynx is clear and moist.  Eyes: Conjunctivae and EOM are normal. Pupils are equal, round, and reactive to light. Right eye exhibits no discharge. Left eye exhibits no discharge. No scleral icterus.  Neck: Normal range of motion. Neck supple. No tracheal deviation present. No thyromegaly present.  Cardiovascular: Normal rate, regular rhythm, normal heart sounds and intact distal pulses.  Exam reveals no gallop and no friction rub.   No murmur heard. Respiratory: Effort normal and breath sounds normal. No respiratory distress. She has no wheezes. She has no rales. She exhibits no tenderness.  GI: Soft. Bowel sounds are normal. She exhibits no distension and no mass. There is no tenderness. There is no rebound and no guarding.  Genitourinary:  Breasts no masses skin changes or nipple changes bilaterally      Vulva is normal without lesions Vagina is pink moist without discharge Cervix  normal in appearance and pap is not done Uterus is normal size shape and contour Adnexa is negative with normal sized ovaries   Musculoskeletal: Normal range of motion. She exhibits no edema and no tenderness.  Neurological: She is alert and oriented to person, place, and time. She has normal reflexes. She displays normal reflexes. No cranial nerve deficit. She exhibits normal muscle tone. Coordination normal.  Skin: Skin is warm and dry. No rash noted. No erythema. No pallor.  Psychiatric: She has a normal mood and affect. Her behavior is normal. Judgment and thought content normal.  Medications Ordered at today's visit: No orders of the defined types were placed in this encounter.   Other orders placed at today's visit: No orders of the defined types were placed in this encounter.     Assessment:    Healthy female exam.    Plan:    Contraception: Nexplanon. Follow up in: 1 year.     No follow-ups on file.

## 2019-02-23 LAB — CERVICOVAGINAL ANCILLARY ONLY
Chlamydia: POSITIVE — AB
Comment: NEGATIVE
Comment: NORMAL
Neisseria Gonorrhea: NEGATIVE

## 2019-02-24 ENCOUNTER — Telehealth: Payer: Self-pay | Admitting: Obstetrics & Gynecology

## 2019-02-24 ENCOUNTER — Telehealth: Payer: Self-pay | Admitting: *Deleted

## 2019-02-24 ENCOUNTER — Encounter: Payer: Self-pay | Admitting: *Deleted

## 2019-02-24 MED ORDER — AZITHROMYCIN 500 MG PO TABS: 1000.0000 mg | ORAL_TABLET | Freq: Once | ORAL | 1 refills | Status: AC

## 2019-02-24 NOTE — Telephone Encounter (Signed)
LMOM for patient to return my call regarding test results.  I have also sent her a message in [place].

## 2019-03-21 ENCOUNTER — Other Ambulatory Visit: Payer: Self-pay

## 2019-03-24 ENCOUNTER — Other Ambulatory Visit: Payer: Medicaid Other

## 2019-03-24 ENCOUNTER — Other Ambulatory Visit (HOSPITAL_COMMUNITY)
Admission: RE | Admit: 2019-03-24 | Discharge: 2019-03-24 | Disposition: A | Discharge status: Home / Self Care | Payer: Medicaid Other | Source: Ambulatory Visit | Attending: Obstetrics & Gynecology | Admitting: Obstetrics & Gynecology

## 2019-03-24 ENCOUNTER — Other Ambulatory Visit: Payer: Self-pay

## 2019-03-24 DIAGNOSIS — Z113 Encounter for screening for infections with a predominantly sexual mode of transmission: Secondary | ICD-10-CM | POA: Diagnosis present

## 2019-03-24 NOTE — Progress Notes (Signed)
   NURSE VISIT- VAGINITIS/STD/POC  SUBJECTIVE:  Karen Moss is a 24 y.o. G0P0 GYN patientfemale here for a vaginal swab for proof of cure after treatment for Chlamydia.   There were no vitals taken for this visit.  Appears well, in no apparent distress  ASSESSMENT: Vaginal swab for proof of cure chlamydia  PLAN: Self-collected vaginal probe for Chlamydia sent to lab Treatment: to be determined once results are received Follow-up as needed if symptoms persist/worsen, or new symptoms develop  Ladonna Snide  03/24/2019 2:51 PM

## 2019-03-28 LAB — CERVICOVAGINAL ANCILLARY ONLY
Chlamydia: NEGATIVE
Comment: NEGATIVE
Comment: NORMAL
Neisseria Gonorrhea: NEGATIVE

## 2019-04-11 ENCOUNTER — Other Ambulatory Visit: Payer: Self-pay | Admitting: Obstetrics and Gynecology

## 2019-04-11 NOTE — Telephone Encounter (Signed)
Rx for Megace for BTB on Nexplanon refilled.

## 2019-04-13 ENCOUNTER — Other Ambulatory Visit: Payer: Medicaid Other | Admitting: Adult Health

## 2019-06-25 ENCOUNTER — Other Ambulatory Visit: Payer: Self-pay | Admitting: Obstetrics & Gynecology

## 2019-10-10 ENCOUNTER — Other Ambulatory Visit: Payer: Self-pay | Admitting: Obstetrics and Gynecology

## 2019-10-12 ENCOUNTER — Other Ambulatory Visit: Payer: Self-pay | Admitting: Obstetrics and Gynecology

## 2020-01-09 ENCOUNTER — Encounter: Payer: Medicaid Other | Admitting: Adult Health

## 2020-01-11 ENCOUNTER — Other Ambulatory Visit: Payer: Self-pay

## 2020-01-11 ENCOUNTER — Encounter: Payer: Self-pay | Admitting: Adult Health

## 2020-01-11 ENCOUNTER — Ambulatory Visit (INDEPENDENT_AMBULATORY_CARE_PROVIDER_SITE_OTHER): Payer: Medicaid Other | Admitting: Adult Health

## 2020-01-11 VITALS — BP 118/77 | HR 72 | Ht 62.0 in | Wt 114.0 lb

## 2020-01-11 DIAGNOSIS — Z3046 Encounter for surveillance of implantable subdermal contraceptive: Secondary | ICD-10-CM

## 2020-01-11 DIAGNOSIS — Z3202 Encounter for pregnancy test, result negative: Secondary | ICD-10-CM | POA: Diagnosis not present

## 2020-01-11 LAB — POCT URINE PREGNANCY: Preg Test, Ur: NEGATIVE

## 2020-01-11 MED ORDER — ETONOGESTREL 68 MG ~~LOC~~ IMPL
68.0000 mg | DRUG_IMPLANT | Freq: Once | SUBCUTANEOUS | Status: AC
  Administered 2020-01-11: 68 mg via SUBCUTANEOUS

## 2020-01-11 NOTE — Progress Notes (Signed)
  Subjective:     Patient ID: Karen Moss, female   DOB: Oct 20, 1995, 24 y.o.   MRN: [id number]  HPI Karen Moss is a 24 year old black female,single, G0P0, in for nexplanon removal and reinsertion. PCP is [employer] PA.   Review of Systems For nexplanon removal and reinsertion Reviewed past medical,surgical, social and family history. Reviewed medications and allergies.     Objective:   Physical Exam BP 118/77 (BP Location: Right Arm, Patient Position: Sitting, Cuff Size: Normal)   Pulse 72   Ht 5\' 2"  (1.575 m)   Wt 114 lb (51.7 kg)   LMP 12/25/2019 (Approximate)   BMI 20.85 kg/m UPT negative Consents signed, and time out called. Left arm cleansed with betadine, and injected with 1.5 cc 2% lidocaine and waited til numb.Under sterile technique a #11 blade was used to make small vertical incision, and a curved forceps was used to easily remove rod.New rod easily inserted and palpated by provider and pt. Steri strips applied. Pressure dressing applied.   Fall risk is low  [facility] - 01/11/20 1514      Pregnancy Intention Screening   Does the patient want to become pregnant in the next year? No    Does the patient's partner want to become pregnant in the next year? No    Would the patient like to discuss contraceptive options today? No      Contraception Wrap Up   Current Method Hormonal Implant    End Method Hormonal Implant    Contraception Counseling Provided No          Assessment:     1. Pregnancy examination or test, negative result  2. Encounter for removal and reinsertion of Nexplanon Lot [id number]    Plan:     Use condoms x 2 weeks, keep clean and dry x 24 hours, no heavy lifting, keep steri strips on x 72 hours, Keep pressure dressing on x 24 hours. Follow up prn problems.   Return in 6 weeks for pap and physical

## 2020-01-11 NOTE — Patient Instructions (Signed)
Use condoms x 2 weeks, keep clean and dry x 24 hours, no heavy lifting, keep steri strips on x 72 hours, Keep pressure dressing on x 24 hours. Follow up prn problems.  

## 2020-02-22 ENCOUNTER — Other Ambulatory Visit: Payer: Medicaid Other | Admitting: Adult Health

## 2020-02-27 ENCOUNTER — Ambulatory Visit (INDEPENDENT_AMBULATORY_CARE_PROVIDER_SITE_OTHER): Payer: Medicaid Other | Admitting: Adult Health

## 2020-02-27 ENCOUNTER — Other Ambulatory Visit: Payer: Self-pay

## 2020-02-27 ENCOUNTER — Other Ambulatory Visit (HOSPITAL_COMMUNITY)
Admission: RE | Admit: 2020-02-27 | Discharge: 2020-02-27 | Disposition: A | Discharge status: Home / Self Care | Payer: Medicaid Other | Source: Ambulatory Visit | Attending: Adult Health | Admitting: Adult Health

## 2020-02-27 ENCOUNTER — Encounter: Payer: Self-pay | Admitting: Adult Health

## 2020-02-27 VITALS — BP 122/77 | HR 64 | Ht 62.0 in | Wt 111.6 lb

## 2020-02-27 DIAGNOSIS — F32A Depression, unspecified: Secondary | ICD-10-CM | POA: Diagnosis not present

## 2020-02-27 DIAGNOSIS — N921 Excessive and frequent menstruation with irregular cycle: Secondary | ICD-10-CM | POA: Insufficient documentation

## 2020-02-27 DIAGNOSIS — Z01419 Encounter for gynecological examination (general) (routine) without abnormal findings: Secondary | ICD-10-CM | POA: Insufficient documentation

## 2020-02-27 DIAGNOSIS — Z975 Presence of (intrauterine) contraceptive device: Secondary | ICD-10-CM | POA: Diagnosis not present

## 2020-02-27 DIAGNOSIS — F331 Major depressive disorder, recurrent, moderate: Secondary | ICD-10-CM | POA: Insufficient documentation

## 2020-02-27 MED ORDER — MEGESTROL ACETATE 40 MG PO TABS: ORAL_TABLET | ORAL | 2 refills | Status: DC

## 2020-02-27 NOTE — Progress Notes (Addendum)
Patient ID: Karen Moss, female   DOB: 28-Nov-1995, 24 y.o.   MRN: [id number] History of Present Illness:  Karen Moss is a 24 year old black female,single, G0P0 in for a well woman gyn exam and pap. She has had both COVID vaccines.  PCP is [employer] PA.  Current Medications, Allergies, Past Medical History, Past Surgical History, Family History and Social History were reviewed in [employer] record.     Review of Systems: Patient denies any headaches, hearing loss, fatigue, blurred vision, shortness of breath, chest pain, abdominal pain, problems with bowel movements, urination, or intercourse. No joint pain or mood swings. Spotting since had nexplanon replaced.    Physical Exam:BP 122/77 (BP Location: Right Arm, Patient Position: Sitting, Cuff Size: Normal)   Pulse 64   Ht 5\' 2"  (1.575 m)   Wt 111 lb 9.6 oz (50.6 kg)   LMP 12/25/2019 (Approximate)   BMI 20.41 kg/m  General:  Well developed, well nourished, no acute distress Skin:  Warm and dry Neck:  Midline trachea, normal thyroid, good ROM, no lymphadenopathy Lungs; Clear to auscultation bilaterally Breast:  No dominant palpable mass, retraction, or nipple discharge,has bilateral nipple rods Cardiovascular: Regular rate and rhythm Abdomen:  Soft, non tender, no hepatosplenomegaly,navel pierced  Pelvic:  External genitalia is normal in appearance, no lesions.  The vagina is normal in appearance. Urethra has no lesions or masses. The cervix is smooth, pap with GC/CHl and high risk HPV genotyping performed .  Uterus is felt to be normal size, shape, and contour.  No adnexal masses or tenderness noted.Bladder is non tender, no masses felt. Extremities/musculoskeletal:  No swelling or varicosities noted, no clubbing or cyanosis Psych:  No mood changes, alert and cooperative,seems happy AA is 2 Fall risk is low PHQ 9 score is 10, no SI sees counselor at [facility] GAD 7 score is 6   [facility] - 02/27/20 1027       Pregnancy Intention Screening   Does the patient want to become pregnant in the next year? No    Does the patient's partner want to become pregnant in the next year? No    Would the patient like to discuss contraceptive options today? No      Contraception Wrap Up   Current Method Hormonal Implant    End Method Hormonal Implant    Contraception Counseling Provided No         Examination chaperoned by Diona Fanti CMA  Impression and Plan: 1. Encounter for gynecological examination with Papanicolaou smear of cervix Pap sent Physical in 1 year Pap in 3 if normal   2. Nexplanon in place  3. Irregular intermenstrual bleeding Will refill megace Meds ordered this encounter  Medications  . megestrol (MEGACE) 40 MG tablet    Sig: TAKE 1 TABLET BY MOUTH ONCE DAILY for spotting    Dispense:  30 tablet    Refill:  2    Order Specific Question:   Supervising Provider    Answer:   EURE, LUTHER H [[id number]]    4. Depression, unspecified depression type Continue counseling

## 2020-03-02 LAB — CYTOLOGY - PAP
Chlamydia: NEGATIVE
Comment: NEGATIVE
Comment: NEGATIVE
Comment: NEGATIVE
Comment: NORMAL
Diagnosis: NEGATIVE
HPV 16: NEGATIVE
HPV 18 / 45: NEGATIVE
High risk HPV: POSITIVE — AB
Neisseria Gonorrhea: NEGATIVE

## 2020-03-06 ENCOUNTER — Encounter: Payer: Self-pay | Admitting: Adult Health

## 2020-03-06 DIAGNOSIS — R87810 Cervical high risk human papillomavirus (HPV) DNA test positive: Secondary | ICD-10-CM

## 2020-03-06 HISTORY — DX: Cervical high risk human papillomavirus (HPV) DNA test positive: R87.810

## 2020-03-20 ENCOUNTER — Other Ambulatory Visit: Payer: Self-pay | Admitting: *Deleted

## 2020-03-20 MED ORDER — MEGESTROL ACETATE 40 MG PO TABS: ORAL_TABLET | ORAL | 2 refills | Status: DC

## 2020-04-28 ENCOUNTER — Encounter: Payer: Self-pay | Admitting: Emergency Medicine

## 2020-04-28 ENCOUNTER — Other Ambulatory Visit: Payer: Self-pay

## 2020-04-28 ENCOUNTER — Ambulatory Visit
Admission: EM | Admit: 2020-04-28 | Discharge: 2020-04-28 | Disposition: A | Discharge status: Home / Self Care | Payer: Medicaid Other | Attending: Emergency Medicine | Admitting: Emergency Medicine

## 2020-04-28 DIAGNOSIS — N390 Urinary tract infection, site not specified: Secondary | ICD-10-CM

## 2020-04-28 DIAGNOSIS — R319 Hematuria, unspecified: Secondary | ICD-10-CM | POA: Insufficient documentation

## 2020-04-28 DIAGNOSIS — R350 Frequency of micturition: Secondary | ICD-10-CM | POA: Insufficient documentation

## 2020-04-28 DIAGNOSIS — R300 Dysuria: Secondary | ICD-10-CM | POA: Insufficient documentation

## 2020-04-28 LAB — POCT URINALYSIS DIP (MANUAL ENTRY)
Bilirubin, UA: NEGATIVE
Glucose, UA: NEGATIVE mg/dL
Ketones, POC UA: NEGATIVE mg/dL
Nitrite, UA: NEGATIVE
Protein Ur, POC: 100 mg/dL — AB
Spec Grav, UA: >=1.03 — AB (ref 1.010–1.025)
Urobilinogen, UA: 0.2 E.U./dL
pH, UA: 5.5 (ref 5.0–8.0)

## 2020-04-28 MED ORDER — NITROFURANTOIN MONOHYD MACRO 100 MG PO CAPS
100.0000 mg | ORAL_CAPSULE | Freq: Two times a day (BID) | ORAL | 0 refills | Status: DC
Start: 2020-04-28 — End: 2021-03-01

## 2020-04-28 NOTE — ED Triage Notes (Signed)
Burning with urination, blood in urine and feels like she has to urinate but only a little comes out. S/s started yesterday.

## 2020-04-28 NOTE — Discharge Instructions (Addendum)
Urine culture sent.  We will call you with the results.   Push fluids and get plenty of rest.   Take antibiotic as directed and to completionrelief Follow up with PCP if symptoms persists Return here or go to ER if you have any new or worsening symptoms such as fever, worsening abdominal pain, nausea/vomiting, flank pain, etc..

## 2020-04-28 NOTE — ED Provider Notes (Addendum)
[facility]   CC: Burning with urination  SUBJECTIVE:  Karen Moss is a 25 y.o. female who presented to the urgent care with a complaint of dysuria, urgency and hematuria for the started yesterday. Denies any precipitating.    Has tried OTC medications without relief.  Symptoms are made worse with urination.  Admits to similar symptoms in the past.  Denies fever, chills, nausea, vomiting, abdominal pain, flank pain, abnormal vaginal discharge or bleeding, hematuria.    LMP: No LMP recorded. Patient has had an implant.  ROS: As in HPI.  All other pertinent ROS negative.     Past Medical History:  Diagnosis Date  . Chlamydia 12/30/2016  . Medical history non-contributory   . 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 NO PAST SURGERIES     Allergies  Allergen Reactions  . Tangerine Flavor Itching and Rash   No current facility-administered medications on file prior to encounter.   Current Outpatient Medications on File Prior to Encounter  Medication Sig Dispense Refill  . etonogestrel (NEXPLANON) 68 MG IMPL implant Inject 1 each into the skin once.    . megestrol (MEGACE) 40 MG tablet TAKE 1 TABLET BY MOUTH ONCE DAILY for spotting 90 tablet 2  . traZODone (DESYREL) 100 MG tablet as needed.   3  . valACYclovir (VALTREX) 1000 MG tablet Take 1 tablet by mouth once daily (Patient taking differently: as needed.) 30 tablet 11   Social History   Socioeconomic History  . Marital status: Single    Spouse name: Not on file  . Number of children: Not on file  . Years of education: Not on file  . Highest education level: Not on file  Occupational History  . Not on file  Tobacco Use  . Smoking status: Never Smoker  . Smokeless tobacco: Never Used  Vaping Use  . Vaping Use:  Never used  Substance and Sexual Activity  . Alcohol use: Yes    Comment: occ  . Drug use: No  . Sexual activity: Yes    Birth control/protection: Implant  Other Topics Concern  . Not on file  Social History Narrative  . Not on file   Social Determinants of Health   Financial Resource Strain: Low Risk   . Difficulty of Paying Living Expenses: Not hard at all  Food Insecurity: No Food Insecurity  . Worried About Charity fundraiser in the Last Year: Never true  . Ran Out of Food in the Last Year: Never true  Transportation Needs: No Transportation Needs  . Lack of Transportation (Medical): No  . Lack of Transportation (Non-Medical): No  Physical Activity: Insufficiently Active  . Days of Exercise per Week: 4 days  . Minutes of Exercise per Session: 30 min  Stress: No Stress Concern Present  . Feeling of Stress : Only a little  Social Connections: Moderately Isolated  . Frequency of Communication with Friends and Family: More than three times a week  . Frequency of Social Gatherings with Friends and Family: More than three times a week  . Attends Religious Services: More than 4 times per year  . Active Member of Clubs or Organizations: No  . Attends Archivist Meetings: Never  . Marital Status: Never married  Intimate Partner Violence:  Not At Risk  . Fear of Current or Ex-Partner: No  . Emotionally Abused: No  . Physically Abused: No  . Sexually Abused: No   Family History  Problem Relation Age of Onset  . Hypertension Mother   . Stroke Mother   . Heart disease Father   . Cancer Father 37       prostate  . Cancer Maternal Grandmother        lung  . Cancer Maternal Grandfather        brain    OBJECTIVE:  Vitals:   04/28/20 1038  BP: (!) 143/98  Pulse: 69  Resp: 18  Temp: 98.8 F (37.1 C)  TempSrc: Oral  SpO2: 98%   General appearance: AOx3 in no acute distress HEENT: NCAT.  Oropharynx clear.  Lungs: clear to auscultation bilaterally without  adventitious breath sounds Heart: regular rate and rhythm.  Radial pulses 2+ symmetrical bilaterally Abdomen: soft; non-distended; no tenderness; bowel sounds present; no guarding or rebound tenderness Back: no CVA tenderness Extremities: no edema; symmetrical with no gross deformities Skin: warm and dry Neurologic: Ambulates from chair to exam table without difficulty Psychological: alert and cooperative; normal mood and affect  Labs Reviewed  POCT URINALYSIS DIP (MANUAL ENTRY) - Abnormal; Notable for the following components:      Result Value   Color, UA orange (*)    Clarity, UA cloudy (*)    Spec Grav, UA >=1.030 (*)    Blood, UA large (*)    Protein Ur, POC =100 (*)    Leukocytes, UA Large (3+) (*)    All other components within normal limits  URINE CULTURE    ASSESSMENT & PLAN:  1. Dysuria   2. Acute UTI   3. Urine frequency   4. Hematuria, unspecified type     Meds ordered this encounter  Medications  . nitrofurantoin, macrocrystal-monohydrate, (MACROBID) 100 MG capsule    Sig: Take 1 capsule (100 mg total) by mouth 2 (two) times daily.    Dispense:  10 capsule    Refill:  0    Discharge Instructions  Urine culture sent.  We will call you with the results.   Push fluids and get plenty of rest.   Take antibiotic as directed and to completionrelief Follow up with PCP if symptoms persists Return here or go to ER if you have any new or worsening symptoms such as fever, worsening abdominal pain, nausea/vomiting, flank pain, etc...  Outlined signs and symptoms indicating need for more acute intervention. Patient verbalized understanding. After Visit Summary given.     Emerson Monte, FNP 04/28/20 1050    Emerson Monte, FNP 04/28/20 1053

## 2020-04-29 LAB — URINE CULTURE: Culture: 80000 — AB

## 2020-04-30 LAB — URINE CULTURE: Special Requests: NORMAL

## 2020-07-02 ENCOUNTER — Other Ambulatory Visit: Payer: Self-pay | Admitting: Obstetrics & Gynecology

## 2021-03-01 ENCOUNTER — Other Ambulatory Visit (HOSPITAL_COMMUNITY)
Admission: RE | Admit: 2021-03-01 | Discharge: 2021-03-01 | Disposition: A | Discharge status: Home / Self Care | Payer: Medicaid Other | Source: Ambulatory Visit | Attending: Adult Health | Admitting: Adult Health

## 2021-03-01 ENCOUNTER — Other Ambulatory Visit: Payer: Self-pay

## 2021-03-01 ENCOUNTER — Ambulatory Visit (INDEPENDENT_AMBULATORY_CARE_PROVIDER_SITE_OTHER): Payer: Medicaid Other | Admitting: Adult Health

## 2021-03-01 ENCOUNTER — Encounter: Payer: Self-pay | Admitting: Adult Health

## 2021-03-01 VITALS — BP 132/81 | HR 80 | Ht 62.0 in | Wt 122.0 lb

## 2021-03-01 DIAGNOSIS — Z01419 Encounter for gynecological examination (general) (routine) without abnormal findings: Secondary | ICD-10-CM

## 2021-03-01 DIAGNOSIS — F1021 Alcohol dependence, in remission: Secondary | ICD-10-CM | POA: Insufficient documentation

## 2021-03-01 DIAGNOSIS — Z113 Encounter for screening for infections with a predominantly sexual mode of transmission: Secondary | ICD-10-CM

## 2021-03-01 DIAGNOSIS — Z975 Presence of (intrauterine) contraceptive device: Secondary | ICD-10-CM | POA: Diagnosis not present

## 2021-03-01 DIAGNOSIS — R87810 Cervical high risk human papillomavirus (HPV) DNA test positive: Secondary | ICD-10-CM

## 2021-03-01 DIAGNOSIS — F419 Anxiety disorder, unspecified: Secondary | ICD-10-CM | POA: Insufficient documentation

## 2021-03-01 DIAGNOSIS — Z3009 Encounter for other general counseling and advice on contraception: Secondary | ICD-10-CM

## 2021-03-01 DIAGNOSIS — F32A Depression, unspecified: Secondary | ICD-10-CM

## 2021-03-01 NOTE — Progress Notes (Signed)
Patient ID: Karen Moss, female   DOB: 1995/08/13, 25 y.o.   MRN: [id number] History of Present Illness: Karen Moss is a 25 year old black female,single, G0P0 in for well woman gyn exam and pap. Pap +HR HPV last year. PCP is [facility] [employer] PA.   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     Review of Systems: Patient denies any hearing loss, fatigue, blurred vision, shortness of breath, chest pain, abdominal pain, problems with bowel movements, urination, or intercourse. No joint pain or mood swings.  +headaches, almost daily, grinds teeth at night, has mouth guard, not working sees dentist in January  Has bleeding with nexplanon and uses megace, has refills   Physical Exam:BP 132/81 (BP Location: Right Arm, Patient Position: Sitting, Cuff Size: Normal)    Pulse 80    Ht 5\' 2"  (1.575 m)    Wt 122 lb (55.3 kg)    LMP 02/11/2021 (Approximate)    BMI 22.31 kg/m   General:  Well developed, well nourished, no acute distress Skin:  Warm and dry Neck:  Midline trachea, normal thyroid, good ROM, no lymphadenopathy Lungs; Clear to auscultation bilaterally Breast:  No dominant palpable mass, retraction, or nipple discharge Cardiovascular: Regular rate and rhythm Abdomen:  Soft, non tender, no hepatosplenomegaly Pelvic:  External genitalia is normal in appearance, no lesions.  The vagina is normal in appearance. Urethra has no lesions or masses. The cervix is smooth, pap with HR HPV genotyping and GC/chlamydia.  Uterus is felt to be normal size, shape, and contour.  No adnexal masses or tenderness noted.Bladder is non tender, no masses felt. Rectal: deferred Extremities/musculoskeletal:  No swelling or varicosities  noted, no clubbing or cyanosis Psych:  No mood changes, alert and cooperative,seems happy AA is 2 Fall risk is low Depression screen [facility] 2/9 03/01/2021 02/27/2020 02/21/2019  Decreased Interest 1 1 0  Down, Depressed, Hopeless 1 1 0  PHQ - 2 Score 2 2 0  Altered sleeping 2 1 1   Tired, decreased energy 2 2 1   Change in appetite 3 3 1   Feeling bad or failure about yourself  1 0 0  Trouble concentrating 0 1 0  Moving slowly or fidgety/restless 0 1 0  Suicidal thoughts 0 0 0  PHQ-9 Score 10 10 3   Difficult doing work/chores - - Not difficult at all   Try to see EAP, declines meds  GAD 7 : Generalized Anxiety Score 03/01/2021 02/27/2020  Nervous, Anxious, on Edge 1 0  Control/stop worrying 1 1  Worry too much - different things 1 1  Trouble relaxing 2 1  Restless 0 1  Easily annoyed or irritable 3 2  Afraid - awful might happen 1 0  Total GAD 7 Score 9 6      Upstream - 03/01/21 1043       Pregnancy Intention Screening   Does the patient want to become pregnant in the next year? No    Does the patient's partner want to become pregnant in the next year? No    Would the patient like to discuss contraceptive options today? No      Contraception Wrap Up   Current Method Hormonal Implant    End Method Hormonal Implant  Contraception Counseling Provided No            Examination chaperoned by Diona Fanti CMA.   Impression and Plan: 1. Encounter for gynecological examination with Papanicolaou smear of cervix Pap sent Physical in 1 year Pap in 3 if normal  2. Nexplanon in place Placed 01/11/20  3. Papanicolaou smear of cervix with positive high risk human papilloma virus (HPV) test Pap sent   4. Anxiety and depression Try EAP,declines meds  5. Screening examination for STD (sexually transmitted disease) Check HIV, RPR, hepatitis C antibody  6. Family Planning

## 2021-03-02 LAB — RPR: RPR Ser Ql: NONREACTIVE

## 2021-03-02 LAB — HIV ANTIBODY (ROUTINE TESTING W REFLEX): HIV Screen 4th Generation wRfx: NONREACTIVE

## 2021-03-02 LAB — HEPATITIS C ANTIBODY: Hep C Virus Ab: 0.7 s/co ratio (ref 0.0–0.9)

## 2021-03-08 LAB — CYTOLOGY - PAP
Chlamydia: NEGATIVE
Comment: NEGATIVE
Comment: NEGATIVE
Comment: NEGATIVE
Comment: NORMAL
Diagnosis: UNDETERMINED — AB
HPV 16: NEGATIVE
HPV 18 / 45: NEGATIVE
High risk HPV: POSITIVE — AB
Neisseria Gonorrhea: NEGATIVE

## 2021-03-13 ENCOUNTER — Encounter: Payer: Self-pay | Admitting: Adult Health

## 2021-03-13 ENCOUNTER — Telehealth: Payer: Self-pay | Admitting: Adult Health

## 2021-03-13 DIAGNOSIS — R87610 Atypical squamous cells of undetermined significance on cytologic smear of cervix (ASC-US): Secondary | ICD-10-CM

## 2021-03-13 HISTORY — DX: Atypical squamous cells of undetermined significance on cytologic smear of cervix (ASC-US): R87.610

## 2021-03-13 NOTE — Telephone Encounter (Signed)
Pt aware pap ASCUS +HPV, will get colpo appt  made

## 2021-03-25 ENCOUNTER — Other Ambulatory Visit: Payer: Self-pay

## 2021-03-25 ENCOUNTER — Ambulatory Visit (INDEPENDENT_AMBULATORY_CARE_PROVIDER_SITE_OTHER): Payer: Self-pay | Admitting: Obstetrics & Gynecology

## 2021-03-25 ENCOUNTER — Encounter: Payer: Self-pay | Admitting: Obstetrics & Gynecology

## 2021-03-25 ENCOUNTER — Other Ambulatory Visit (HOSPITAL_COMMUNITY)
Admission: RE | Admit: 2021-03-25 | Discharge: 2021-03-25 | Disposition: A | Discharge status: Home / Self Care | Payer: Medicaid Other | Source: Ambulatory Visit | Attending: Obstetrics & Gynecology | Admitting: Obstetrics & Gynecology

## 2021-03-25 VITALS — BP 134/79 | HR 74 | Ht 62.0 in | Wt 126.0 lb

## 2021-03-25 DIAGNOSIS — R87810 Cervical high risk human papillomavirus (HPV) DNA test positive: Secondary | ICD-10-CM

## 2021-03-25 DIAGNOSIS — R87610 Atypical squamous cells of undetermined significance on cytologic smear of cervix (ASC-US): Secondary | ICD-10-CM | POA: Insufficient documentation

## 2021-03-25 DIAGNOSIS — Z3202 Encounter for pregnancy test, result negative: Secondary | ICD-10-CM

## 2021-03-25 LAB — POCT URINE PREGNANCY: Preg Test, Ur: NEGATIVE

## 2021-03-25 NOTE — Progress Notes (Signed)
° ° °  Colposcopy Procedure Note:    Colposcopy Procedure Note  Indications:  2022 ASCUS/+HR HPV/neg 16 +18 2021 normal cytology/ +HR/neg 16 + 18   2019 ASCCP recommendation: colposcopy  Smoker:  No. New sexual partner:  No.    History of abnormal Pap: yes  Procedure Details  The risks and benefits of the procedure and Written informed consent obtained.  Speculum placed in vagina and excellent visualization of cervix achieved, cervix swabbed x 3 with acetic acid solution.  Findings: Adequate colposcopy is noted today.  Cervix: light AWE of entire transformation, no punctation mosaicism or abnormal vessels; Marland Kitchen Vaginal inspection: normal without visible lesions. Vulvar colposcopy: vulvar colposcopy not performed.  Specimens: x 1  Complications: none.  Colposcopic Impression:   Plan(Based on 2019 ASCCP recommendations) Specimens labelled and sent to Pathology.

## 2021-03-26 ENCOUNTER — Encounter: Payer: Self-pay | Admitting: Obstetrics & Gynecology

## 2021-03-27 LAB — SURGICAL PATHOLOGY

## 2021-03-29 ENCOUNTER — Other Ambulatory Visit: Payer: Self-pay | Admitting: Adult Health

## 2021-03-29 MED ORDER — ONDANSETRON HCL 4 MG PO TABS: 4.0000 mg | ORAL_TABLET | Freq: Three times a day (TID) | ORAL | 1 refills | Status: DC | PRN

## 2021-03-29 MED ORDER — MECLIZINE HCL 25 MG PO TABS: 25.0000 mg | ORAL_TABLET | Freq: Three times a day (TID) | ORAL | 0 refills | Status: DC | PRN

## 2021-03-29 NOTE — Progress Notes (Signed)
Will rx zofran

## 2021-07-05 ENCOUNTER — Ambulatory Visit (INDEPENDENT_AMBULATORY_CARE_PROVIDER_SITE_OTHER): Payer: Self-pay | Admitting: Family Medicine

## 2021-07-05 ENCOUNTER — Encounter: Payer: Self-pay | Admitting: Family Medicine

## 2021-07-05 VITALS — BP 118/78 | HR 106 | Ht 62.0 in | Wt 126.6 lb

## 2021-07-05 DIAGNOSIS — F419 Anxiety disorder, unspecified: Secondary | ICD-10-CM

## 2021-07-05 DIAGNOSIS — F32A Depression, unspecified: Secondary | ICD-10-CM

## 2021-07-05 DIAGNOSIS — E559 Vitamin D deficiency, unspecified: Secondary | ICD-10-CM

## 2021-07-05 DIAGNOSIS — R7301 Impaired fasting glucose: Secondary | ICD-10-CM

## 2021-07-05 NOTE — Assessment & Plan Note (Signed)
-  Stable ?-Pt knows of the [facility]( EAP) to speak with a therapist if needed.  ?

## 2021-07-05 NOTE — Patient Instructions (Addendum)
I appreciate the opportunity to provide care to you today! ?  ?-follow up: 6 months ? ?-please return to the office to have your labs drawn for your cbc, cmp, tsh, vit.d, lipid panel and hA1c ? ? ? ? ?It was a pleasure to see you and I look forward to continuing to work together on your health and well-being. ?Please do not hesitate to call the office if you need care or have questions about your care. ?  ?Have a wonderful day and week. ?With Gratitude, ?Alvira Monday MSN, FNP-BC ? ?

## 2021-07-05 NOTE — Progress Notes (Signed)
? ?New Patient Office Visit ? ?Subjective:  ?Patient ID: Karen Moss, female    DOB: [birth year]  Age: 26 y.o. MRN: [id number] ? ?CC:  ?Chief Complaint  ?Patient presents with  ? New Patient (Initial Visit)  ?  Establishing care.   ? ? ?HPI ? Karen Moss is a 26 y.o. female with past medical history of major depressive disorder presents for establishing care. 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 She reports migraines and headaches from clenching her teeth, triggered when her father was sick. She reports that she is currently seeing the dentist to fix the broken tooth from grinding at it. She reports that she will be getting laser eye surgery on 07/11/21 and cannot take triptans. She has been prescribed Flexeril by her dentist. Her next appt with her dentist is on  07/17/2021. She reports dizziness with her menstrual cycles and sleep disturbance when depressed.The patient is currently stable with her depression, stating that she is doing well without treatments.  ?  ?Past Medical History:  ?Diagnosis Date  ? ASCUS with positive high risk HPV cervical 03/13/2021  ? 03/13/21 will get colpo per ASCCP  5 year CIN 3+ risk is 5.4 %  ? Chlamydia 12/30/2016  ? Medical history non-contributory   ? Papanicolaou smear of cervix with positive high risk human papilloma virus (HPV) test 03/06/2020  ? 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 ? ?Past Surgical History:  ?Procedure Laterality Date  ? NO PAST SURGERIES    ? ? ?Family History  ?Problem Relation Age of Onset  ? Hypertension Mother   ? Stroke Mother   ? Heart disease Father   ? Cancer Father 56  ?     prostate  ? Cancer Maternal Grandmother   ?     lung  ? Cancer Maternal Grandfather   ?     brain  ? ? ?Social History  ? ?Socioeconomic History  ? Marital status: Single  ?  Spouse name: Not on file  ? Number of children: Not on file  ? Years of education: Not on file  ? Highest education level: Not on  file  ?Occupational History  ? Not on file  ?Tobacco Use  ? Smoking status: Never  ? Smokeless tobacco: Never  ?Vaping Use  ? Vaping Use: Never used  ?Substance and Sexual Activity  ? Alcohol use: Yes  ?  Comment: occ  ? Drug use: No  ? Sexual activity: Yes  ?  Birth control/protection: Implant  ?Other Topics Concern  ? Not on file  ?Social History Narrative  ? Not on file  ? ?Social Determinants of Health  ? ?Financial Resource Strain: Low Risk   ? Difficulty of Paying Living Expenses: Not hard at all  ?Food Insecurity: No Food Insecurity  ? Worried About Charity fundraiser in the Last Year: Never true  ? Ran Out of Food in the Last Year: Never true  ?Transportation Needs: No Transportation Needs  ? Lack of Transportation (Medical): No  ? Lack of Transportation (Non-Medical): No  ?Physical Activity: Sufficiently Active  ? Days of Exercise per Week: 4 days  ? Minutes of Exercise per Session: 90 min  ?Stress: Stress Concern Present  ? Feeling of Stress : Rather much  ?Social Connections: Moderately Isolated  ? Frequency of Communication with Friends and Family: More than three times a week  ? Frequency  of Social Gatherings with Friends and Family: Once a week  ? Attends Religious Services: More than 4 times per year  ? Active Member of Clubs or Organizations: No  ? Attends Archivist Meetings: Never  ? Marital Status: Never married  ?Intimate Partner Violence: Not At Risk  ? Fear of Current or Ex-Partner: No  ? Emotionally Abused: No  ? Physically Abused: No  ? Sexually Abused: No  ? ? ?ROS ?Review of Systems  ?Constitutional:  Negative for chills, fatigue and fever.  ?Eyes:  Negative for pain, redness and itching.  ?Endocrine: Negative for polydipsia, polyphagia and polyuria.  ?Genitourinary:  Negative for dysuria, frequency and urgency.  ?Neurological:  Positive for dizziness and headaches. Negative for tremors and weakness.  ?Psychiatric/Behavioral:  Positive for sleep disturbance. Negative for  self-injury.   ? ?Objective:  ? ?Today's Vitals: BP 118/78 (BP Location: Left Arm)   Pulse (!) 106   Ht _0  (1.575 m)   Wt 126 lb 9.6 oz (57.4 kg)   SpO2 97%   BMI 23.16 kg/m?  ? ?Physical Exam ?Constitutional:   ?   Appearance: Normal appearance.  ?HENT:  ?   Head: Normocephalic.  ?   Right Ear: External ear normal.  ?   Left Ear: External ear normal.  ?   Nose: Nose normal. No congestion or rhinorrhea.  ?   Mouth/Throat:  ?   Mouth: Mucous membranes are moist.  ?Eyes:  ?   General:     ?   Right eye: No discharge.     ?   Left eye: No discharge.  ?   Extraocular Movements: Extraocular movements intact.  ?   Pupils: Pupils are equal, round, and reactive to light.  ?Cardiovascular:  ?   Rate and Rhythm: Normal rate and regular rhythm.  ?   Pulses: Normal pulses.  ?   Heart sounds: Normal heart sounds.  ?Pulmonary:  ?   Effort: Pulmonary effort is normal.  ?   Breath sounds: Normal breath sounds.  ?Abdominal:  ?   Palpations: Abdomen is soft.  ?Genitourinary: ?   Comments: deferred ?Musculoskeletal:  ?   Cervical back: Normal range of motion.  ?   Right lower leg: No edema.  ?   Left lower leg: No edema.  ?Skin: ?   General: Skin is warm.  ?   Findings: No lesion or rash.  ?Neurological:  ?   Mental Status: She is alert and oriented to person, place, and time.  ?Psychiatric:  ?   Comments: Normal affect  ? ? ?Assessment & Plan:  ? ?Problem List Items Addressed This Visit   ? ?  ? Other  ? Anxiety and depression - Primary  ?  -Stable ?-Pt knows of the [facility]( EAP) to speak with a therapist if needed.  ? ?  ?  ? Relevant Orders  ? CBC with Differential/Platelet  ? CMP14+EGFR  ? TSH + free T4  ? Lipid panel  ? ?Other Visit Diagnoses   ? ? IFG (impaired fasting glucose)      ? Relevant Orders  ? Hemoglobin A1c  ? Vitamin D deficiency      ? Relevant Orders  ? Vitamin D (25 hydroxy)  ? ?  ? ? ?Outpatient Encounter Medications as of 07/05/2021  ?Medication Sig  ? etonogestrel  (NEXPLANON) 68 MG IMPL implant Inject 1 each into the skin once.  ? megestrol (MEGACE) 40 MG tablet TAKE 1  TABLET BY MOUTH ONCE DAILY FOR  SPOTTING  ? ondansetron (ZOFRAN) 4 MG tablet Take 1 tablet (4 mg total) by mouth every 8 (eight) hours as needed for nausea or vomiting.  ? valACYclovir (VALTREX) 1000 MG tablet TAKE ONE TABLET BY MOUTH DAILY  ? meclizine (ANTIVERT) 25 MG tablet Take 1 tablet (25 mg total) by mouth 3 (three) times daily as needed for dizziness.  ? ?No facility-administered encounter medications on file as of 07/05/2021.  ? ? ?Follow-up: Return in about 6 months (around 01/04/2022).  ? ?Alvira Monday, FNP ?

## 2021-07-08 ENCOUNTER — Ambulatory Visit: Payer: Medicaid Other | Admitting: Family Medicine

## 2021-07-11 HISTORY — PX: NM RENAL LASIX (ARMC HX): HXRAD1213

## 2021-07-12 ENCOUNTER — Other Ambulatory Visit: Payer: Self-pay | Admitting: Family Medicine

## 2021-07-12 DIAGNOSIS — E559 Vitamin D deficiency, unspecified: Secondary | ICD-10-CM

## 2021-07-12 LAB — CMP14+EGFR
ALT: 10 IU/L (ref 0–32)
AST: 16 IU/L (ref 0–40)
Albumin/Globulin Ratio: 2 (ref 1.2–2.2)
Albumin: 4.7 g/dL (ref 3.9–5.0)
Alkaline Phosphatase: 63 IU/L (ref 44–121)
BUN/Creatinine Ratio: 12 (ref 9–23)
BUN: 12 mg/dL (ref 6–20)
Bilirubin Total: 1.5 mg/dL — ABNORMAL HIGH (ref 0.0–1.2)
CO2: 22 mmol/L (ref 20–29)
Calcium: 10 mg/dL (ref 8.7–10.2)
Chloride: 105 mmol/L (ref 96–106)
Creatinine, Ser: 1.01 mg/dL — ABNORMAL HIGH (ref 0.57–1.00)
Globulin, Total: 2.3 g/dL (ref 1.5–4.5)
Glucose: 89 mg/dL (ref 70–99)
Potassium: 4.2 mmol/L (ref 3.5–5.2)
Sodium: 141 mmol/L (ref 134–144)
Total Protein: 7 g/dL (ref 6.0–8.5)
eGFR: 79 mL/min/{1.73_m2} (ref >59)

## 2021-07-12 LAB — LIPID PANEL
Chol/HDL Ratio: 3.9 ratio (ref 0.0–4.4)
Cholesterol, Total: 124 mg/dL (ref 100–199)
HDL: 32 mg/dL — ABNORMAL LOW (ref >39)
LDL Chol Calc (NIH): 81 mg/dL (ref 0–99)
Triglycerides: 45 mg/dL (ref 0–149)
VLDL Cholesterol Cal: 11 mg/dL (ref 5–40)

## 2021-07-12 LAB — CBC WITH DIFFERENTIAL/PLATELET
Basophils Absolute: 0.1 10*3/uL (ref 0.0–0.2)
Basos: 1 %
EOS (ABSOLUTE): 0.1 10*3/uL (ref 0.0–0.4)
Eos: 1 %
Hematocrit: 42.7 % (ref 34.0–46.6)
Hemoglobin: 14.7 g/dL (ref 11.1–15.9)
Immature Grans (Abs): 0 10*3/uL (ref 0.0–0.1)
Immature Granulocytes: 0 %
Lymphocytes Absolute: 2.3 10*3/uL (ref 0.7–3.1)
Lymphs: 23 %
MCH: 31.7 pg (ref 26.6–33.0)
MCHC: 34.4 g/dL (ref 31.5–35.7)
MCV: 92 fL (ref 79–97)
Monocytes Absolute: 0.6 10*3/uL (ref 0.1–0.9)
Monocytes: 6 %
Neutrophils Absolute: 7 10*3/uL (ref 1.4–7.0)
Neutrophils: 69 %
Platelets: 255 10*3/uL (ref 150–450)
RBC: 4.63 x10E6/uL (ref 3.77–5.28)
RDW: 11.6 % — ABNORMAL LOW (ref 11.7–15.4)
WBC: 10 10*3/uL (ref 3.4–10.8)

## 2021-07-12 LAB — HEMOGLOBIN A1C
Est. average glucose Bld gHb Est-mCnc: 94 mg/dL
Hgb A1c MFr Bld: 4.9 % (ref 4.8–5.6)

## 2021-07-12 LAB — VITAMIN D 25 HYDROXY (VIT D DEFICIENCY, FRACTURES): Vit D, 25-Hydroxy: 12.2 ng/mL — ABNORMAL LOW (ref 30.0–100.0)

## 2021-07-12 LAB — TSH+FREE T4
Free T4: 1.57 ng/dL (ref 0.82–1.77)
TSH: 0.726 u[IU]/mL (ref 0.450–4.500)

## 2021-07-12 MED ORDER — VITAMIN D (ERGOCALCIFEROL) 1.25 MG (50000 UNIT) PO CAPS
50000.0000 [IU] | ORAL_CAPSULE | ORAL | 1 refills | Status: DC
Start: 2021-07-12 — End: 2021-09-27

## 2021-07-12 NOTE — Progress Notes (Signed)
Advised the patient to take vit D supplements once weekly because her vit D is low. The order is placed and sent to her pharmacy.  ? ?All other labs were within normal limits. ? ?

## 2021-09-27 ENCOUNTER — Other Ambulatory Visit: Payer: Self-pay | Admitting: Family Medicine

## 2021-09-27 DIAGNOSIS — E559 Vitamin D deficiency, unspecified: Secondary | ICD-10-CM

## 2021-12-19 ENCOUNTER — Other Ambulatory Visit: Payer: Self-pay | Admitting: Family Medicine

## 2021-12-19 DIAGNOSIS — E559 Vitamin D deficiency, unspecified: Secondary | ICD-10-CM

## 2021-12-19 MED ORDER — VITAMIN D (ERGOCALCIFEROL) 1.25 MG (50000 UNIT) PO CAPS: 50000.0000 [IU] | ORAL_CAPSULE | ORAL | 0 refills | Status: DC

## 2022-01-10 ENCOUNTER — Ambulatory Visit: Payer: Medicaid Other | Admitting: Family Medicine

## 2022-03-02 ENCOUNTER — Other Ambulatory Visit: Payer: Self-pay | Admitting: Family Medicine

## 2022-03-02 DIAGNOSIS — E559 Vitamin D deficiency, unspecified: Secondary | ICD-10-CM

## 2022-03-21 ENCOUNTER — Encounter: Payer: Self-pay | Admitting: Adult Health

## 2022-03-21 ENCOUNTER — Ambulatory Visit (INDEPENDENT_AMBULATORY_CARE_PROVIDER_SITE_OTHER): Payer: 59 | Admitting: Adult Health

## 2022-03-21 ENCOUNTER — Other Ambulatory Visit (HOSPITAL_COMMUNITY)
Admission: RE | Admit: 2022-03-21 | Discharge: 2022-03-21 | Disposition: A | Discharge status: Home / Self Care | Payer: 59 | Source: Ambulatory Visit | Attending: Adult Health | Admitting: Adult Health

## 2022-03-21 VITALS — BP 108/69 | HR 72 | Ht 62.0 in | Wt 144.0 lb

## 2022-03-21 DIAGNOSIS — Z113 Encounter for screening for infections with a predominantly sexual mode of transmission: Secondary | ICD-10-CM | POA: Insufficient documentation

## 2022-03-21 DIAGNOSIS — Z01419 Encounter for gynecological examination (general) (routine) without abnormal findings: Secondary | ICD-10-CM | POA: Insufficient documentation

## 2022-03-21 DIAGNOSIS — R87610 Atypical squamous cells of undetermined significance on cytologic smear of cervix (ASC-US): Secondary | ICD-10-CM | POA: Diagnosis not present

## 2022-03-21 DIAGNOSIS — Z975 Presence of (intrauterine) contraceptive device: Secondary | ICD-10-CM

## 2022-03-21 DIAGNOSIS — Z0001 Encounter for general adult medical examination with abnormal findings: Secondary | ICD-10-CM | POA: Insufficient documentation

## 2022-03-21 DIAGNOSIS — R87810 Cervical high risk human papillomavirus (HPV) DNA test positive: Secondary | ICD-10-CM | POA: Diagnosis not present

## 2022-03-21 NOTE — Progress Notes (Signed)
Patient ID: Karen Moss, female   DOB: October 07, 1995, 27 y.o.   MRN: [id number] History of Present Illness: Karen Moss is a 27 year old black female,single, G0P0, in for a well woman gyn exam and pap. Her pap last pap was 03/01/21,ASCUS +HPV and colpo with biopsy was CIN 1. 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  PCP is Alvira Monday.   Current Medications, Allergies, Past Medical History, Past Surgical History, Family History and Social History were reviewed in [employer] record.     Review of Systems: Patient denies any headaches, hearing loss, fatigue, blurred vision, shortness of breath, chest pain, abdominal pain, problems with bowel movements, urination, or intercourse. No joint pain or mood swings.     Physical Exam:BP 108/69 (BP Location: Right Arm, Patient Position: Sitting, Cuff Size: Normal)   Pulse 72   Ht '5\' 2"'$  (1.575 m)   Wt 144 lb (65.3 kg)   LMP 03/11/2022 (Approximate)   BMI 26.34 kg/m   General:  Well developed, well nourished, no acute distress Skin:  Warm and dry Neck:  Midline trachea, normal thyroid, good ROM, no lymphadenopathy Lungs; Clear to auscultation bilaterally Breast:  No dominant palpable mass, retraction, or nipple discharge,has bilateral nipple rods Cardiovascular: Regular rate and rhythm Abdomen:  Soft, non tender, no hepatosplenomegaly Pelvic:  External genitalia is normal in appearance, no lesions.  The vagina is normal in appearance, brown period blood. Urethra has no lesions or masses. The cervix is smooth,mpap with GC/CHL and HR HPV genotyping performed. Uterus is felt to be normal size, shape, and contour.  No adnexal masses or tenderness noted.Bladder is non tender, no masses felt. Extremities/musculoskeletal:  No swelling or varicosities noted, no clubbing or cyanosis Psych:  No mood changes, alert and cooperative,seems happy AA is 0 Fall risk is low    03/21/2022   11:39 AM 07/05/2021     1:25 PM 03/01/2021   10:43 AM  Depression screen PHQ 2/9  Decreased Interest 1 0 1  Down, Depressed, Hopeless 0 1 1  PHQ - 2 Score '1 1 2  '$ Altered sleeping 0  2  Tired, decreased energy 1  2  Change in appetite 0  3  Feeling bad or failure about yourself  0  1  Trouble concentrating 1  0  Moving slowly or fidgety/restless 0  0  Suicidal thoughts 0  0  PHQ-9 Score 3  10       03/21/2022   11:40 AM 03/01/2021   10:44 AM 02/27/2020   10:26 AM  GAD 7 : Generalized Anxiety Score  Nervous, Anxious, on Edge 1 1 0  Control/stop worrying 0 1 1  Worry too much - different things 0 1 1  Trouble relaxing 0 2 1  Restless 0 0 1  Easily annoyed or irritable '1 3 2  '$ Afraid - awful might happen 0 1 0  Total GAD 7 Score '2 9 6    '$ Upstream - 03/21/22 1139       Pregnancy Intention Screening   Does the patient want to become pregnant in the next year? No    Does the patient's partner want to become pregnant in the next year? No    Would the patient like to discuss contraceptive options today? No      Contraception Wrap Up   Current Method Hormonal Implant    End Method Hormonal Implant    Contraception Counseling Provided No  Examination chaperoned by Dewitt Hoes RN     Impression and Plan: 1. Encounter for routine gynecological examination with Papanicolaou smear of cervix Pap sent Pap in 3 years if normal Physical in 1 year Labs with PCP   2. Nexplanon in place Placed 01/11/20  3. 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

## 2022-03-21 NOTE — Addendum Note (Signed)
Addended by: Jesusita Oka on: 03/21/2022 12:08 PM   Modules accepted: Orders

## 2022-03-25 LAB — CYTOLOGY - PAP
Chlamydia: NEGATIVE
Comment: NEGATIVE
Comment: NEGATIVE
Comment: NEGATIVE
Comment: NEGATIVE
Comment: NEGATIVE
Comment: NORMAL
Diagnosis: NEGATIVE
Diagnosis: REACTIVE
HPV 16: NEGATIVE
HPV 18 / 45: NEGATIVE
High risk HPV: POSITIVE — AB
Neisseria Gonorrhea: NEGATIVE
Trichomonas: NEGATIVE

## 2022-03-28 ENCOUNTER — Encounter: Payer: Self-pay | Admitting: Family Medicine

## 2022-03-28 ENCOUNTER — Ambulatory Visit: Payer: 59 | Admitting: Family Medicine

## 2022-03-28 VITALS — BP 127/63 | HR 62 | Ht 62.0 in | Wt 145.1 lb

## 2022-03-28 DIAGNOSIS — M778 Other enthesopathies, not elsewhere classified: Secondary | ICD-10-CM

## 2022-03-28 MED ORDER — NAPROXEN 500 MG PO TABS: 500.0000 mg | ORAL_TABLET | Freq: Two times a day (BID) | ORAL | 0 refills | Status: AC

## 2022-03-28 NOTE — Progress Notes (Signed)
Established Patient Office Visit  Subjective:  Patient ID: Karen Moss, female    DOB: 09/16/1995  Age: 27 y.o. MRN: [id number]  CC:  Chief Complaint  Patient presents with   Follow-up    6 month f/u, pt reports right hand pain since 10/15/2021, has trouble lifting sometimes from this, or moving and flexing.     HPI Karen Moss is a 27 y.o. female with past medical history of major depressive disorder presents for f/u of  chronic medical conditions. For the details of today's visit, please refer to the assessment and plan.    related to overus   Past Medical History:  Diagnosis Date   ASCUS with positive high risk HPV cervical 03/13/2021   03/13/21 will get colpo per [facility]  5 year CIN 3+ risk is 5.4 %   Chlamydia 12/30/2016   Medical history non-contributory    Papanicolaou smear of cervix with positive high risk human papilloma virus (HPV) test 03/06/2020   02/2020 repeat in 1 year with HVP testing, [per [facility] 5 year risk CIN3 + 4.8 %    Past Surgical History:  Procedure Laterality Date   NM RENAL LASIX ([place] HX) Bilateral 07/11/2021   NO PAST SURGERIES      Family History  Problem Relation Age of Onset   Hypertension Mother    Stroke Mother    Heart disease Father    Cancer Father 76       prostate   Cancer Maternal Grandmother        lung   Cancer Maternal Grandfather        brain    Social History   Socioeconomic History   Marital status: Single    Spouse name: Not on file   Number of children: Not on file   Years of education: Not on file   Highest education level: Not on file  Occupational History   Not on file  Tobacco Use   Smoking status: Never   Smokeless tobacco: Never  Vaping Use   Vaping Use: Never used  Substance and Sexual Activity   Alcohol use: Yes    Comment: occ   Drug use: No   Sexual activity: Yes    Birth control/protection: Implant  Other Topics Concern   Not on file  Social History Narrative   Not on file    Social Determinants of Health   Financial Resource Strain: Low Risk  (03/21/2022)   Overall Financial Resource Strain (CARDIA)    Difficulty of Paying Living Expenses: Not hard at all  Food Insecurity: No Food Insecurity (03/21/2022)   Hunger Vital Sign    Worried About Running Out of Food in the Last Year: Never true    [place] in the Last Year: Never true  Transportation Needs: No Transportation Needs (03/21/2022)   PRAPARE - Hydrologist (Medical): No    Lack of Transportation (Non-Medical): No  Physical Activity: Insufficiently Active (03/21/2022)   Exercise Vital Sign    Days of Exercise per Week: 3 days    Minutes of Exercise per Session: 30 min  Stress: No Stress Concern Present (03/21/2022)   Weed    Feeling of Stress : Only a little  Social Connections: Moderately Isolated (03/21/2022)   Social Connection and Isolation Panel [NHANES]    Frequency of Communication with Friends and Family: More than three times a week    Frequency  of Social Gatherings with Friends and Family: Once a week    Attends Religious Services: More than 4 times per year    Active Member of [employer] or Organizations: No    Attends Archivist Meetings: Never    Marital Status: Never married  Intimate Partner Violence: Not At Risk (03/21/2022)   Humiliation, Afraid, Rape, and Kick questionnaire    Fear of Current or Ex-Partner: No    Emotionally Abused: No    Physically Abused: No    Sexually Abused: No    Outpatient Medications Prior to Visit  Medication Sig Dispense Refill   etonogestrel (NEXPLANON) 68 MG IMPL implant Inject 1 each into the skin once.     megestrol (MEGACE) 40 MG tablet TAKE 1 TABLET BY MOUTH ONCE DAILY FOR  SPOTTING 30 tablet 1   ondansetron (ZOFRAN) 4 MG tablet Take 1 tablet (4 mg total) by mouth every 8 (eight) hours as needed for nausea or vomiting. 20 tablet 1    valACYclovir (VALTREX) 1000 MG tablet TAKE ONE TABLET BY MOUTH DAILY 30 tablet 11   Vitamin D, Ergocalciferol, (DRISDOL) 1.25 MG (50000 UNIT) CAPS capsule Take 1 capsule by mouth once a week 5 capsule 0   No facility-administered medications prior to visit.    Allergies  Allergen Reactions   Tangerine Flavor Itching and Rash    ROS Review of Systems  Constitutional:  Negative for chills and fever.  Eyes:  Negative for visual disturbance.  Respiratory:  Negative for chest tightness and shortness of breath.   Musculoskeletal:        Right hand pain  Neurological:  Negative for dizziness and headaches.      Objective:    Physical Exam HENT:     Head: Normocephalic.     Mouth/Throat:     Mouth: Mucous membranes are moist.  Cardiovascular:     Rate and Rhythm: Normal rate.     Heart sounds: Normal heart sounds.  Pulmonary:     Effort: Pulmonary effort is normal.     Breath sounds: Normal breath sounds.  Musculoskeletal:     Comments: Pain noted with extension of the  dorsal wrist at the central zone No pain with flexion, radial and ulnar deviation Negative for finkelstein's test    Neurological:     Mental Status: She is alert.     BP 127/63   Pulse 62   Ht '5\' 2"'$  (1.575 m)   Wt 145 lb 1.9 oz (65.8 kg)   LMP 03/11/2022 (Approximate)   SpO2 94%   BMI 26.54 kg/m  Wt Readings from Last 3 Encounters:  03/28/22 145 lb 1.9 oz (65.8 kg)  03/21/22 144 lb (65.3 kg)  07/05/21 126 lb 9.6 oz (57.4 kg)    Lab Results  Component Value Date   TSH 0.726 07/11/2021   Lab Results  Component Value Date   WBC 10.0 07/11/2021   HGB 14.7 07/11/2021   HCT 42.7 07/11/2021   MCV 92 07/11/2021   PLT 255 07/11/2021   Lab Results  Component Value Date   NA 141 07/11/2021   K 4.2 07/11/2021   CO2 22 07/11/2021   GLUCOSE 89 07/11/2021   BUN 12 07/11/2021   CREATININE 1.01 (H) 07/11/2021   BILITOT 1.5 (H) 07/11/2021   ALKPHOS 63 07/11/2021   AST 16 07/11/2021   ALT 10  07/11/2021   PROT 7.0 07/11/2021   ALBUMIN 4.7 07/11/2021   CALCIUM 10.0 07/11/2021   EGFR 79 07/11/2021  Lab Results  Component Value Date   CHOL 124 07/11/2021   Lab Results  Component Value Date   HDL 32 (L) 07/11/2021   Lab Results  Component Value Date   LDLCALC 81 07/11/2021   Lab Results  Component Value Date   TRIG 45 07/11/2021   Lab Results  Component Value Date   CHOLHDL 3.9 07/11/2021   Lab Results  Component Value Date   HGBA1C 4.9 07/11/2021      Assessment & Plan:  Right wrist tendonitis -     Naproxen; Take 1 tablet (500 mg total) by mouth 2 (two) times daily with a meal for 7 days.  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   Alvira Monday, FNP

## 2022-03-28 NOTE — Patient Instructions (Addendum)
I appreciate the opportunity to provide care to you today!    Follow up:  3 months CPE  Labs: Next Visit  Wrist Tendinitis I recommend conservative treatment with rest, including a reduction of aggravating activities Will provide a short supply of NSAID to decrease inflammation Please continue using your splint with activities to reduce pain  Referrals today-  Orthopedics   Please continue to a heart-healthy diet and increase your physical activities. Try to exercise for 6mns at least five times a week.      It was a pleasure to see you and I look forward to continuing to work together on your health and well-being. Please do not hesitate to call the office if you need care or have questions about your care.   Have a wonderful day and week. With Gratitude, GAlvira MondayMSN, FNP-BC

## 2022-03-28 NOTE — Assessment & Plan Note (Addendum)
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

## 2022-04-03 ENCOUNTER — Other Ambulatory Visit: Payer: Self-pay | Admitting: Family Medicine

## 2022-04-03 DIAGNOSIS — E559 Vitamin D deficiency, unspecified: Secondary | ICD-10-CM

## 2022-04-07 ENCOUNTER — Other Ambulatory Visit: Payer: Self-pay | Admitting: Family Medicine

## 2022-04-07 DIAGNOSIS — E559 Vitamin D deficiency, unspecified: Secondary | ICD-10-CM

## 2022-04-07 MED ORDER — VITAMIN D (ERGOCALCIFEROL) 1.25 MG (50000 UNIT) PO CAPS
50000.0000 [IU] | ORAL_CAPSULE | ORAL | 0 refills | Status: DC
Start: 2022-04-07 — End: 2022-06-17

## 2022-04-07 NOTE — Telephone Encounter (Signed)
Rx sent 

## 2022-04-18 ENCOUNTER — Ambulatory Visit (INDEPENDENT_AMBULATORY_CARE_PROVIDER_SITE_OTHER): Payer: 59

## 2022-04-18 ENCOUNTER — Encounter: Payer: Self-pay | Admitting: Orthopedic Surgery

## 2022-04-18 ENCOUNTER — Ambulatory Visit (INDEPENDENT_AMBULATORY_CARE_PROVIDER_SITE_OTHER): Payer: 59 | Admitting: Orthopedic Surgery

## 2022-04-18 VITALS — BP 137/85 | HR 74 | Ht 62.0 in | Wt 140.0 lb

## 2022-04-18 DIAGNOSIS — M25531 Pain in right wrist: Secondary | ICD-10-CM

## 2022-04-18 DIAGNOSIS — M659 Synovitis and tenosynovitis, unspecified: Secondary | ICD-10-CM

## 2022-04-18 DIAGNOSIS — M65931 Unspecified synovitis and tenosynovitis, right forearm: Secondary | ICD-10-CM

## 2022-04-21 NOTE — Progress Notes (Signed)
Chief Complaint  Patient presents with   Wrist Pain    Right no injury pain since July    27 year old female right-hand-dominant presents with unexplained right wrist and hand pain with some numbness on the back of the hand and tenderness over the wrist joint pain with wrist range of motion especially flexion no trauma  She does note that ergonomic changes have helped her wrist along with placing and ibuprofen  Review of systems negative for carpal tunnel symptoms  Physical Exam Vitals and nursing note reviewed.  Constitutional:      Appearance: Normal appearance.  HENT:     Head: Normocephalic and atraumatic.  Eyes:     General: No scleral icterus.       Right eye: No discharge.        Left eye: No discharge.     Extraocular Movements: Extraocular movements intact.     Conjunctiva/sclera: Conjunctivae normal.     Pupils: Pupils are equal, round, and reactive to light.  Cardiovascular:     Rate and Rhythm: Normal rate.     Pulses: Normal pulses.  Musculoskeletal:     Comments: Examination of the wrist compared to the left wrist shows no swelling of the right wrist.  There is tenderness over the wrist joint.  Scaphoid nontender carpal tunnel nontender compression test carpal tunnel normal sensation to soft touch normal globally.  Vascular exam normal extremity warm to touch.  No clicking no popping Watson test negative  TFCC nontender.  Full range of motion all tendons intact  Skin:    General: Skin is warm and dry.     Capillary Refill: Capillary refill takes less than 2 seconds.  Neurological:     General: No focal deficit present.     Mental Status: She is alert and oriented to person, place, and time.  Psychiatric:        Mood and Affect: Mood normal.        Behavior: Behavior normal.        Thought Content: Thought content normal.        Judgment: Judgment normal.     Imaging unremarkable  Unclear other than overuse injury of the right upper extremity recommend  bracing anti-inflammatories and repeat evaluation continue ergonomic changes at work

## 2022-04-28 ENCOUNTER — Other Ambulatory Visit: Payer: Self-pay | Admitting: Adult Health

## 2022-05-15 ENCOUNTER — Encounter: Payer: Self-pay | Admitting: Radiology

## 2022-06-06 ENCOUNTER — Ambulatory Visit: Payer: 59 | Admitting: Orthopedic Surgery

## 2022-06-14 ENCOUNTER — Other Ambulatory Visit: Payer: Self-pay | Admitting: Family Medicine

## 2022-06-14 DIAGNOSIS — E559 Vitamin D deficiency, unspecified: Secondary | ICD-10-CM

## 2022-06-20 ENCOUNTER — Ambulatory Visit: Payer: 59 | Admitting: Orthopedic Surgery

## 2022-06-20 ENCOUNTER — Encounter: Payer: Self-pay | Admitting: Orthopedic Surgery

## 2022-06-20 DIAGNOSIS — S63509A Unspecified sprain of unspecified wrist, initial encounter: Secondary | ICD-10-CM

## 2022-06-20 DIAGNOSIS — M25531 Pain in right wrist: Secondary | ICD-10-CM | POA: Diagnosis not present

## 2022-06-20 DIAGNOSIS — M92211 Osteochondrosis (juvenile) of carpal lunate [Kienbock], right hand: Secondary | ICD-10-CM | POA: Diagnosis not present

## 2022-06-20 DIAGNOSIS — M8430XA Stress fracture, unspecified site, initial encounter for fracture: Secondary | ICD-10-CM

## 2022-06-20 NOTE — Addendum Note (Signed)
Addended byCaffie Damme on: 06/20/2022 10:20 AM   Modules accepted: Orders

## 2022-06-20 NOTE — Patient Instructions (Signed)
While we are working on your approval for MRI please go ahead and call to schedule your appointment with [facility] within at least one (1) week.   Central Scheduling [phone]  

## 2022-06-20 NOTE — Progress Notes (Signed)
Chief Complaint  Patient presents with   Follow-up    Recheck on right wrist   April 18, 2022  27 year old female right-hand-dominant presents with unexplained right wrist and hand pain with some numbness on the back of the hand and tenderness over the wrist joint pain with wrist range of motion especially flexion no trauma   She does note that ergonomic changes have helped her wrist along with placing and ibuprofen   Imaging unremarkable   Unclear other than overuse injury of the right upper extremity recommend bracing anti-inflammatories and repeat evaluation continue ergonomic changes at work  Today  Karen Moss comes in today with no significant improvement in her symptoms.  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  Skin:    General: Skin is warm.     Capillary Refill: Capillary refill takes less than 2 seconds.  Neurological:     General: No focal deficit present.     Mental Status: She is alert and oriented to person, place, and time.  Psychiatric:        Mood and Affect: Mood normal.    Right Hand Exam   Tenderness  Right hand tenderness location: The only place I could reproduce the tenderness was distal radius central portion and over the joint.  Range of Motion  Wrist  Extension:  normal Right wrist extension: Normal but increased pain. Flexion:  normal  Pronation:  normal  Supination:  normal   Muscle Strength  Wrist extension: 5/5  Wrist flexion: 5/5  Grip: 5/5   Tests  Tinel's sign (median  nerve): negative Finkelstein's test: negative  Other  Erythema: absent Scars: absent Sensation: normal Pulse: present       Differential diagnosis kienbocks disease, stress reaction distal radius, wrist joint sprain  Encounter Diagnoses  Name Primary?   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

## 2022-06-27 ENCOUNTER — Ambulatory Visit (HOSPITAL_BASED_OUTPATIENT_CLINIC_OR_DEPARTMENT_OTHER)
Admission: RE | Admit: 2022-06-27 | Discharge: 2022-06-27 | Disposition: A | Discharge status: Home / Self Care | Payer: 59 | Source: Ambulatory Visit | Attending: Orthopedic Surgery | Admitting: Orthopedic Surgery

## 2022-06-27 DIAGNOSIS — S63509A Unspecified sprain of unspecified wrist, initial encounter: Secondary | ICD-10-CM | POA: Diagnosis not present

## 2022-06-27 DIAGNOSIS — S66919A Strain of unspecified muscle, fascia and tendon at wrist and hand level, unspecified hand, initial encounter: Secondary | ICD-10-CM | POA: Diagnosis not present

## 2022-06-27 DIAGNOSIS — M8430XA Stress fracture, unspecified site, initial encounter for fracture: Secondary | ICD-10-CM | POA: Diagnosis not present

## 2022-06-27 DIAGNOSIS — M92211 Osteochondrosis (juvenile) of carpal lunate [Kienbock], right hand: Secondary | ICD-10-CM | POA: Diagnosis not present

## 2022-06-27 DIAGNOSIS — M25531 Pain in right wrist: Secondary | ICD-10-CM | POA: Diagnosis not present

## 2022-07-01 ENCOUNTER — Telehealth: Payer: Self-pay | Admitting: Orthopedic Surgery

## 2022-07-01 ENCOUNTER — Encounter: Payer: Self-pay | Admitting: Family Medicine

## 2022-07-01 ENCOUNTER — Telehealth: Payer: Self-pay | Admitting: Radiology

## 2022-07-01 ENCOUNTER — Ambulatory Visit (INDEPENDENT_AMBULATORY_CARE_PROVIDER_SITE_OTHER): Payer: 59 | Admitting: Family Medicine

## 2022-07-01 ENCOUNTER — Encounter: Payer: 59 | Admitting: Family Medicine

## 2022-07-01 VITALS — BP 121/75 | HR 76 | Ht 62.0 in | Wt 151.0 lb

## 2022-07-01 DIAGNOSIS — E0789 Other specified disorders of thyroid: Secondary | ICD-10-CM | POA: Diagnosis not present

## 2022-07-01 DIAGNOSIS — M25531 Pain in right wrist: Secondary | ICD-10-CM

## 2022-07-01 DIAGNOSIS — R7301 Impaired fasting glucose: Secondary | ICD-10-CM

## 2022-07-01 DIAGNOSIS — S638X1A Sprain of other part of right wrist and hand, initial encounter: Secondary | ICD-10-CM

## 2022-07-01 DIAGNOSIS — E7849 Other hyperlipidemia: Secondary | ICD-10-CM

## 2022-07-01 DIAGNOSIS — M778 Other enthesopathies, not elsewhere classified: Secondary | ICD-10-CM | POA: Diagnosis not present

## 2022-07-01 DIAGNOSIS — Z0001 Encounter for general adult medical examination with abnormal findings: Secondary | ICD-10-CM

## 2022-07-01 DIAGNOSIS — E559 Vitamin D deficiency, unspecified: Secondary | ICD-10-CM | POA: Diagnosis not present

## 2022-07-01 NOTE — Assessment & Plan Note (Signed)
Encouraged to follow-up with orthopedics on July 04, 2022

## 2022-07-01 NOTE — Telephone Encounter (Signed)
Put in order for hand surgery.

## 2022-07-01 NOTE — Assessment & Plan Note (Signed)
Physical exam as documented Discussed heart-healthy diet  Encouraged to Exercise: If you are able: 30 -60 minutes a day ,4 days a week, or 150 minutes a week. The longer the better. Combine stretch, strength, and aerobic activities Encourage to eat whole Food, Plant Predominant Nutrition is highly recommended: Eat Plenty of vegetables, Mushrooms, fruits, Legumes, Whole Grains, Nuts, seeds in lieu of processed meats, processed snacks/pastries red meat, poultry, eggs.  Will f/u in 1 year for CPE    

## 2022-07-01 NOTE — Telephone Encounter (Signed)
-----   Message from Vickki Hearing, MD sent at 07/01/2022  1:30 PM EDT ----- Molli Knock to schedule patient with hand surgery for scapholunate ligament tear

## 2022-07-01 NOTE — Progress Notes (Signed)
Complete physical exam  Patient: Karen Moss   DOB: 1995/05/09   27 y.o. Female  MRN: [id number]  Subjective:    Chief Complaint  Patient presents with   Annual Exam    Cpe today.    Karen Moss is a 27 y.o. female who presents today for a complete physical exam. She reports consuming a general diet.  She reports running  for an hour with her dog  She generally feels well. She reports sleeping well. She does not have additional problems to discuss today.    Most recent fall risk assessment:    07/01/2022    1:10 PM  Fall Risk   Falls in the past year? 0  Number falls in past yr: 0  Injury with Fall? 0  Risk for fall due to : No Fall Risks  Follow up Falls evaluation completed     Most recent depression screenings:    03/21/2022   11:39 AM 07/05/2021    1:25 PM  PHQ 2/9 Scores  PHQ - 2 Score 1 1  PHQ- 9 Score 3     Dental: No current dental problems and Last dental visit: 07/01/2022  Patient Active Problem List   Diagnosis Date Noted   Right wrist tendinitis 03/28/2022   Encounter for annual general medical examination with abnormal findings in adult 03/21/2022   ASCUS with positive high risk HPV cervical 03/13/2021   Anxiety and depression 03/01/2021   Papanicolaou smear of cervix with positive high risk human papilloma virus (HPV) test 03/06/2020   Encounter for gynecological examination with Papanicolaou smear of cervix 02/27/2020   Depression 02/27/2020   Irregular intermenstrual bleeding 02/27/2020   Encounter for removal and reinsertion of Nexplanon 01/11/2020   Pregnancy examination or test, negative result 01/11/2020   Encounter for well woman exam with routine gynecological exam 02/16/2018   Family planning 02/16/2018   Screening examination for STD (sexually transmitted disease) 02/16/2018   Nexplanon in place 02/16/2018   Wax in ear 02/16/2018   Chlamydia 12/30/2016   Nexplanon insertion 01/03/2014   MDD (major depressive disorder),  single episode, moderate 08/21/2013   Past Medical History:  Diagnosis Date   ASCUS with positive high risk HPV cervical 03/13/2021   03/13/21 will get colpo per [facility]  5 year CIN 3+ risk is 5.4 %   Chlamydia 12/30/2016   Medical history non-contributory    Papanicolaou smear of cervix with positive high risk human papilloma virus (HPV) test 03/06/2020   02/2020 repeat in 1 year with HVP testing, [per [facility] 5 year risk CIN3 + 4.8 %   Past Surgical History:  Procedure Laterality Date   REFRACTIVE SURGERY Bilateral    Lasik eye surgery   Social History   Tobacco Use   Smoking status: Never   Smokeless tobacco: Never  Vaping Use   Vaping Use: Never used  Substance Use Topics   Alcohol use: Yes    Comment: occ   Drug use: No   Social History   Socioeconomic History   Marital status: Single    Spouse name: Not on file   Number of children: Not on file   Years of education: Not on file   Highest education level: Not on file  Occupational History   Not on file  Tobacco Use   Smoking status: Never   Smokeless tobacco: Never  Vaping Use   Vaping Use: Never used  Substance and Sexual Activity   Alcohol use: Yes    Comment:  occ   Drug use: No   Sexual activity: Yes    Birth control/protection: Implant  Other Topics Concern   Not on file  Social History Narrative   Not on file   Social Determinants of Health   Financial Resource Strain: Low Risk  (03/21/2022)   Overall Financial Resource Strain (CARDIA)    Difficulty of Paying Living Expenses: Not hard at all  Food Insecurity: No Food Insecurity (03/21/2022)   Hunger Vital Sign    Worried About Running Out of Food in the Last Year: Never true    Ran Out of Food in the Last Year: Never true  Transportation Needs: No Transportation Needs (03/21/2022)   PRAPARE - Administrator, Civil Service (Medical): No    Lack of Transportation (Non-Medical): No  Physical Activity: Insufficiently Active (03/21/2022)    Exercise Vital Sign    Days of Exercise per Week: 3 days    Minutes of Exercise per Session: 30 min  Stress: No Stress Concern Present (03/21/2022)   [employer] of Occupational Health - Occupational Stress Questionnaire    Feeling of Stress : Only a little  Social Connections: Moderately Isolated (03/21/2022)   Social Connection and Isolation Panel [NHANES]    Frequency of Communication with Friends and Family: More than three times a week    Frequency of Social Gatherings with Friends and Family: Once a week    Attends Religious Services: More than 4 times per year    Active Member of [employer] or Organizations: No    Attends Banker Meetings: Never    Marital Status: Never married  Intimate Partner Violence: Not At Risk (03/21/2022)   Humiliation, Afraid, Rape, and Kick questionnaire    Fear of Current or Ex-Partner: No    Emotionally Abused: No    Physically Abused: No    Sexually Abused: No   Family Status  Relation Name Status   Mother  Alive   Father  Alive   MGM  Deceased   MGF  Deceased   PGF  Deceased   PGM  Deceased   Family History  Problem Relation Age of Onset   Hypertension Mother    Stroke Mother    Heart disease Father    Cancer Father 62       prostate   Cancer Maternal Grandmother        lung   Cancer Maternal Grandfather        brain   Allergies  Allergen Reactions   Tangerine Flavor Itching and Rash      Patient Care Team: Gilmore Laroche, FNP as PCP - General (Family Medicine)   Outpatient Medications Prior to Visit  Medication Sig   etonogestrel (NEXPLANON) 68 MG IMPL implant Inject 1 each into the skin once.   megestrol (MEGACE) 40 MG tablet TAKE 1 TABLET BY MOUTH ONCE DAILY FOR  SPOTTING   valACYclovir (VALTREX) 1000 MG tablet TAKE ONE TABLET BY MOUTH DAILY   Vitamin D, Ergocalciferol, (DRISDOL) 1.25 MG (50000 UNIT) CAPS capsule Take 1 capsule by mouth once a week   No facility-administered medications prior to visit.     Review of Systems  Constitutional:  Negative for chills, fever and malaise/fatigue.  HENT:  Negative for congestion and sinus pain.   Eyes:  Negative for pain, discharge and redness.  Respiratory:  Negative for cough, sputum production and shortness of breath.   Cardiovascular:  Negative for chest pain, palpitations, claudication and leg swelling.  Gastrointestinal:  Negative for diarrhea, heartburn and nausea.  Genitourinary:  Negative for flank pain and frequency.  Musculoskeletal:  Negative for back pain and joint pain.  Skin:  Negative for itching.  Neurological:  Negative for dizziness, seizures and headaches.  Endo/Heme/Allergies:  Negative for environmental allergies.  Psychiatric/Behavioral:  Negative for memory loss. The patient does not have insomnia.        Objective:    BP 121/75   Pulse 76   Ht 5\' 2"  (1.575 m)   Wt 151 lb 0.6 oz (68.5 kg)   SpO2 98%   BMI 27.63 kg/m  BP Readings from Last 3 Encounters:  07/01/22 121/75  04/18/22 137/85  03/28/22 127/63   Wt Readings from Last 3 Encounters:  07/01/22 151 lb 0.6 oz (68.5 kg)  04/18/22 140 lb (63.5 kg)  03/28/22 145 lb 1.9 oz (65.8 kg)      Physical Exam HENT:     Head: Normocephalic.     Left Ear: External ear normal.     Mouth/Throat:     Mouth: Mucous membranes are moist.  Eyes:     Extraocular Movements: Extraocular movements intact.     Pupils: Pupils are equal, round, and reactive to light.  Cardiovascular:     Rate and Rhythm: Normal rate and regular rhythm.     Heart sounds: No murmur heard. Pulmonary:     Effort: Pulmonary effort is normal.     Breath sounds: Normal breath sounds.  Abdominal:     Palpations: Abdomen is soft.     Tenderness: There is no right CVA tenderness or left CVA tenderness.  Musculoskeletal:     Right wrist: Tenderness (right wrist brace in placed) present.     Right lower leg: No edema.     Left lower leg: No edema.  Skin:    Findings: No lesion or rash.   Neurological:     Mental Status: She is alert and oriented to person, place, and time.     GCS: GCS eye subscore is 4. GCS verbal subscore is 5. GCS motor subscore is 6.     Cranial Nerves: No facial asymmetry.     Sensory: No sensory deficit.     Motor: No weakness.     Coordination: Coordination normal. Finger-Nose-Finger Test normal.     Gait: Gait normal.  Psychiatric:        Judgment: Judgment normal.     No results found for any visits on 07/01/22. Last CBC Lab Results  Component Value Date   WBC 10.0 07/11/2021   HGB 14.7 07/11/2021   HCT 42.7 07/11/2021   MCV 92 07/11/2021   MCH 31.7 07/11/2021   RDW 11.6 (L) 07/11/2021   PLT 255 07/11/2021   Last metabolic panel Lab Results  Component Value Date   GLUCOSE 89 07/11/2021   NA 141 07/11/2021   K 4.2 07/11/2021   CL 105 07/11/2021   CO2 22 07/11/2021   BUN 12 07/11/2021   CREATININE 1.01 (H) 07/11/2021   EGFR 79 07/11/2021   CALCIUM 10.0 07/11/2021   PROT 7.0 07/11/2021   ALBUMIN 4.7 07/11/2021   LABGLOB 2.3 07/11/2021   AGRATIO 2.0 07/11/2021   BILITOT 1.5 (H) 07/11/2021   ALKPHOS 63 07/11/2021   AST 16 07/11/2021   ALT 10 07/11/2021   Last lipids Lab Results  Component Value Date   CHOL 124 07/11/2021   HDL 32 (L) 07/11/2021   LDLCALC 81 07/11/2021   TRIG 45 07/11/2021   CHOLHDL 3.9  07/11/2021   Last hemoglobin A1c Lab Results  Component Value Date   HGBA1C 4.9 07/11/2021   Last thyroid functions Lab Results  Component Value Date   TSH 0.726 07/11/2021   Last vitamin D Lab Results  Component Value Date   VD25OH 12.2 (L) 07/11/2021   Last vitamin B12 and Folate No results found for: "VITAMINB12", "FOLATE"      Assessment & Plan:    Routine Health Maintenance and Physical Exam  Immunization History  Administered Date(s) Administered   Influenza-Unspecified 01/07/2022   PFIZER(Purple Top)SARS-COV-2 Vaccination 12/01/2019, 12/22/2019   Tdap 04/22/2019    Health Maintenance   Topic Date Due   COVID-19 Vaccine (3 - 2023-24 season) 11/15/2021   INFLUENZA VACCINE  10/16/2022   PAP-Cervical Cytology Screening  03/21/2025   PAP SMEAR-Modifier  03/21/2025   DTaP/Tdap/Td (2 - Td or Tdap) 04/21/2029   Hepatitis C Screening  Completed   HIV Screening  Completed   HPV VACCINES  Aged Out    Discussed health benefits of physical activity, and encouraged her to engage in regular exercise appropriate for her age and condition.  Encounter for annual general medical examination with abnormal findings in adult Assessment & Plan: Physical exam as documented Discussed heart-healthy diet  Encouraged to Exercise: If you are able: 30 -60 minutes a day ,4 days a week, or 150 minutes a week. The longer the better. Combine stretch, strength, and aerobic activities Encourage to eat whole Food, Plant Predominant Nutrition is highly recommended: Eat Plenty of vegetables, Mushrooms, fruits, Legumes, Whole Grains, Nuts, seeds in lieu of processed meats, processed snacks/pastries red meat, poultry, eggs.  Will f/u in 1 year for CPE      Right wrist tendinitis Assessment & Plan: Encouraged to follow-up with orthopedics on July 04, 2022   Vitamin D deficiency -     VITAMIN D 25 Hydroxy (Vit-D Deficiency, Fractures)  Impaired fasting blood sugar -     Hemoglobin A1c  Other hyperlipidemia -     CBC with Differential/Platelet -     CMP14+EGFR -     Lipid panel  Other specified disorders of thyroid -     TSH + free T4    Return in about 1 year (around 07/01/2023).     Gilmore Laroche, FNP

## 2022-07-01 NOTE — Telephone Encounter (Signed)
Patient given following results  IMPRESSION: 1. 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 Suspected tiny ganglion cyst between the scaphoid and lunate involving the ligament, with adjacent reactive marrow edema in the radial aspect of the lunate.     Electronically Signed   By: Obie Dredge M.D.   On: 06/30/2022 16:18   I gave Karen Moss her results upon further review she did have some type of injury when the patient fell at work  I told her we need to have her see a hand specialist and that we would arrange an appointment for her to see 1

## 2022-07-01 NOTE — Patient Instructions (Addendum)
I appreciate the opportunity to provide care to you today!    Follow up:  5 months  Labs: please stop by the lab during the week to get your blood drawn (CBC, CMP, TSH, Lipid profile, HgA1c, Vit D)  Physical activity helps: Lower your blood glucose, improve your heart health, lower your blood pressure and cholesterol, burn calories to help manage her weight, gave you energy, lower stress, and improve his sleep.  The American diabetes Association (ADA) recommends being active for 2-1/2 hours (150 minutes) or more week.  Exercise for 30 minutes, 5 days a week (150 minutes total)    Please continue to a heart-healthy diet and increase your physical activities.     It was a pleasure to see you and I look forward to continuing to work together on your health and well-being. Please do not hesitate to call the office if you need care or have questions about your care.   Have a wonderful day and week. With Gratitude, Gilmore Laroche MSN, FNP-BC

## 2022-07-02 ENCOUNTER — Telehealth: Payer: Self-pay | Admitting: Orthopedic Surgery

## 2022-07-02 NOTE — Telephone Encounter (Signed)
I called her to advise will send to [facility] told her it will take several weeks and gve her the number sometimes that speeds things up

## 2022-07-02 NOTE — Telephone Encounter (Signed)
Dr. Mort Sawyers pt - spoke w/the patient she stated that Dr. Romeo Apple called her w/her MRI results yesterday and that he told her he was referring her out to a surgeon.  She stated she got a call from [facility] this morning and her insurance is out of network.  She would like to be referred somewhere else.  [phone]

## 2022-07-03 DIAGNOSIS — E559 Vitamin D deficiency, unspecified: Secondary | ICD-10-CM | POA: Diagnosis not present

## 2022-07-03 DIAGNOSIS — E7849 Other hyperlipidemia: Secondary | ICD-10-CM | POA: Diagnosis not present

## 2022-07-03 DIAGNOSIS — E0789 Other specified disorders of thyroid: Secondary | ICD-10-CM | POA: Diagnosis not present

## 2022-07-03 DIAGNOSIS — R7301 Impaired fasting glucose: Secondary | ICD-10-CM | POA: Diagnosis not present

## 2022-07-04 ENCOUNTER — Ambulatory Visit: Payer: 59 | Admitting: Orthopedic Surgery

## 2022-07-04 LAB — CBC WITH DIFFERENTIAL/PLATELET
Basophils Absolute: 0.1 10*3/uL (ref 0.0–0.2)
Basos: 1 %
EOS (ABSOLUTE): 0.1 10*3/uL (ref 0.0–0.4)
Eos: 1 %
Hematocrit: 42.2 % (ref 34.0–46.6)
Hemoglobin: 13.9 g/dL (ref 11.1–15.9)
Immature Grans (Abs): 0.1 10*3/uL (ref 0.0–0.1)
Immature Granulocytes: 1 %
Lymphocytes Absolute: 2.4 10*3/uL (ref 0.7–3.1)
Lymphs: 22 %
MCH: 31.3 pg (ref 26.6–33.0)
MCHC: 32.9 g/dL (ref 31.5–35.7)
MCV: 95 fL (ref 79–97)
Monocytes Absolute: 0.8 10*3/uL (ref 0.1–0.9)
Monocytes: 7 %
Neutrophils Absolute: 7.3 10*3/uL — ABNORMAL HIGH (ref 1.4–7.0)
Neutrophils: 68 %
Platelets: 270 10*3/uL (ref 150–450)
RBC: 4.44 x10E6/uL (ref 3.77–5.28)
RDW: 11.8 % (ref 11.7–15.4)
WBC: 10.7 10*3/uL (ref 3.4–10.8)

## 2022-07-04 LAB — CMP14+EGFR
ALT: 12 IU/L (ref 0–32)
AST: 17 IU/L (ref 0–40)
Albumin/Globulin Ratio: 1.6 (ref 1.2–2.2)
Albumin: 3.9 g/dL — ABNORMAL LOW (ref 4.0–5.0)
Alkaline Phosphatase: 74 IU/L (ref 44–121)
BUN/Creatinine Ratio: 11 (ref 9–23)
BUN: 9 mg/dL (ref 6–20)
Bilirubin Total: 1.1 mg/dL (ref 0.0–1.2)
CO2: 21 mmol/L (ref 20–29)
Calcium: 9.5 mg/dL (ref 8.7–10.2)
Chloride: 104 mmol/L (ref 96–106)
Creatinine, Ser: 0.82 mg/dL (ref 0.57–1.00)
Globulin, Total: 2.4 g/dL (ref 1.5–4.5)
Glucose: 88 mg/dL (ref 70–99)
Potassium: 4.1 mmol/L (ref 3.5–5.2)
Sodium: 139 mmol/L (ref 134–144)
Total Protein: 6.3 g/dL (ref 6.0–8.5)
eGFR: 100 mL/min/{1.73_m2} (ref >59)

## 2022-07-04 LAB — LIPID PANEL
Chol/HDL Ratio: 3.6 ratio (ref 0.0–4.4)
Cholesterol, Total: 125 mg/dL (ref 100–199)
HDL: 35 mg/dL — ABNORMAL LOW (ref >39)
LDL Chol Calc (NIH): 74 mg/dL (ref 0–99)
Triglycerides: 79 mg/dL (ref 0–149)
VLDL Cholesterol Cal: 16 mg/dL (ref 5–40)

## 2022-07-04 LAB — HEMOGLOBIN A1C
Est. average glucose Bld gHb Est-mCnc: 97 mg/dL
Hgb A1c MFr Bld: 5 % (ref 4.8–5.6)

## 2022-07-04 LAB — VITAMIN D 25 HYDROXY (VIT D DEFICIENCY, FRACTURES): Vit D, 25-Hydroxy: 49.2 ng/mL (ref 30.0–100.0)

## 2022-07-04 LAB — TSH+FREE T4
Free T4: 1.21 ng/dL (ref 0.82–1.77)
TSH: 0.997 u[IU]/mL (ref 0.450–4.500)

## 2022-07-23 ENCOUNTER — Other Ambulatory Visit: Payer: Self-pay | Admitting: Obstetrics & Gynecology

## 2022-08-31 ENCOUNTER — Other Ambulatory Visit: Payer: Self-pay | Admitting: Family Medicine

## 2022-08-31 DIAGNOSIS — E559 Vitamin D deficiency, unspecified: Secondary | ICD-10-CM

## 2022-09-10 DIAGNOSIS — S63501A Unspecified sprain of right wrist, initial encounter: Secondary | ICD-10-CM | POA: Diagnosis not present

## 2022-09-10 DIAGNOSIS — S63511A Sprain of carpal joint of right wrist, initial encounter: Secondary | ICD-10-CM | POA: Diagnosis not present

## 2022-09-11 DIAGNOSIS — S63501A Unspecified sprain of right wrist, initial encounter: Secondary | ICD-10-CM | POA: Insufficient documentation

## 2022-09-11 DIAGNOSIS — S63519A Sprain of carpal joint of unspecified wrist, initial encounter: Secondary | ICD-10-CM | POA: Insufficient documentation

## 2022-10-15 ENCOUNTER — Encounter: Payer: Self-pay | Admitting: Family Medicine

## 2022-10-15 ENCOUNTER — Ambulatory Visit (INDEPENDENT_AMBULATORY_CARE_PROVIDER_SITE_OTHER): Payer: 59 | Admitting: Family Medicine

## 2022-10-15 VITALS — BP 134/77 | HR 73 | Ht 62.0 in | Wt 155.0 lb

## 2022-10-15 DIAGNOSIS — R41840 Attention and concentration deficit: Secondary | ICD-10-CM

## 2022-10-15 NOTE — Patient Instructions (Addendum)
I appreciate the opportunity to provide care to you today!    Labs: please stop by the lab today to get your blood drawn (CBC, B 12 and Folate, Zinc, Magnesium, Vit D)  A lack of focus can sometimes be linked to deficiencies in certain vitamins and minerals. Common deficiencies that might contribute to difficulties with focus and concentration include:  Vitamin B12: Essential for brain function and the production of neurotransmitters. A deficiency can lead to cognitive impairments, including problems with focus and memory.  Vitamin D: Low levels of vitamin D have been associated with cognitive difficulties, including issues with concentration.  Iron: Iron deficiency, which can lead to anemia, is known to cause fatigue, brain fog, and difficulties with concentration.  Omega-3 Fatty Acids: Though not a vitamin, omega-3s are essential for brain health, and a deficiency can affect focus and cognitive function.  Magnesium: Magnesium plays a role in neurotransmitter function and can influence concentration and focus.  Folate (Vitamin B9): Folate is important for cognitive function, and a deficiency can lead to problems with memory and concentration.  Zinc: Zinc is involved in brain function, and low levels may be associated with cognitive issues, including focus and attention.   Food sources  1. Vitamin B12 Animal Products: Beef, chicken, fish (especially salmon and tuna), eggs, dairy products (milk, cheese, yogurt) Fortified Foods: Some breakfast cereals, plant-based milk (e.g., almond, soy), nutritional yeast 2. Vitamin D Fatty Fish: Salmon, mackerel, sardines, tuna Egg Yolks Fortified Foods: Fortified milk, orange juice, cereals Mushrooms: Especially those exposed to sunlight 3. Iron Red Meat: Beef, lamb, pork Poultry: Chicken, [place] Seafood: Clams, oysters, mussels, sardines Plant Sources: Spinach, lentils, beans, tofu, quinoa, pumpkin seeds Fortified Foods: Iron-fortified  cereals and breads 4. Omega-3 Fatty Acids Fatty Fish: Salmon, mackerel, sardines, trout, herring Chia Seeds Flaxseeds and Flaxseed Oil Walnuts Algal Oil: A plant-based omega-3 source from algae, available as a supplement 5. Magnesium Leafy Greens: Spinach, Swiss chard, kale Nuts and Seeds: Almonds, cashews, pumpkin seeds Legumes: Black beans, lentils, chickpeas Whole Grains: Brown rice, quinoa, whole wheat bread Dark Chocolate Avocados 6. Folate (Vitamin B9) Leafy Greens: Spinach, kale, romaine lettuce Asparagus Brussels Sprouts Beans and Peas: Black-eyed peas, kidney beans, lentils Fortified Foods: Breakfast cereals, breads, and pasta Citrus Fruits: Oranges, lemons, limes, grapefruit 7. Zinc Meat: Beef, pork, lamb Shellfish: Oysters, crab, lobster Legumes: Chickpeas, lentils, beans Seeds: Pumpkin seeds, sesame seeds, hemp seeds Nuts: Cashews, almonds, peanuts Dairy Products: Cheese, milk, yogurt Whole Grains: Oats, quinoa, fortified cereals  Ensuring a balanced diet that includes these nutrients can help maintain optimal cognitive function.   Here are some options to get the neuropsychiatric testing to have a definitive answer on if you have ADD or not: Eula Flax, NP at [facility] [phone]) or [facility] [phone]) or Ronney Asters at [facility]. [facility] ([facility] of [facility]) [phone] has multiple providers. [facility] [phone] has several providers specializing in ADHD/Bipolar= Andrena Mews, PhD is expert at Adult ADHD, Verna Czech, PhD treats adults with both diagnoses.     Please continue to a heart-healthy diet and increase your physical activities. Try to exercise for at least five days a week.    It was a pleasure to see you and I look forward to continuing to work together on your health and  well-being. Please do not hesitate to call the office if you need care or have questions about your care.  In case of emergency, please visit the  Emergency Department for urgent care, or contact our clinic at [phone] to schedule an appointment. We're here to help you!   Have a wonderful day and week. With Gratitude, Gilmore Laroche MSN, FNP-BC

## 2022-10-15 NOTE — Progress Notes (Signed)
Established Patient Office Visit  Subjective:  Patient ID: Karen Moss, female    DOB: 1995-08-12  Age: 27 y.o. MRN: [id number]  CC:  Chief Complaint  Patient presents with   Follow-up    Would like to discuss options for mental health, feels like its starting to affect her job.    HPI Karen Moss is a 27 y.o. female with past medical history of  MDD presents for f/u of  chronic medical conditions. For the details of today's visit, please refer to the assessment and plan.     Past Medical History:  Diagnosis Date   ASCUS with positive high risk HPV cervical 03/13/2021   03/13/21 will get colpo per [facility]  5 year CIN 3+ risk is 5.4 %   Chlamydia 12/30/2016   Medical history non-contributory    Papanicolaou smear of cervix with positive high risk human papilloma virus (HPV) test 03/06/2020   02/2020 repeat in 1 year with HVP testing, [per [facility] 5 year risk CIN3 + 4.8 %    Past Surgical History:  Procedure Laterality Date   REFRACTIVE SURGERY Bilateral    Lasik eye surgery    Family History  Problem Relation Age of Onset   Hypertension Mother    Stroke Mother    Heart disease Father    Cancer Father 42       prostate   Cancer Maternal Grandmother        lung   Cancer Maternal Grandfather        brain    Social History   Socioeconomic History   Marital status: Single    Spouse name: Not on file   Number of children: Not on file   Years of education: Not on file   Highest education level: Associate degree: occupational, Scientist, product/process development, or vocational program  Occupational History   Not on file  Tobacco Use   Smoking status: Never   Smokeless tobacco: Never  Vaping Use   Vaping status: Never Used  Substance and Sexual Activity   Alcohol use: Yes    Comment: occ   Drug use: No   Sexual activity: Yes    Birth control/protection: Implant  Other Topics Concern   Not on file  Social History Narrative   Not on file   Social Determinants of Health    Financial Resource Strain: Low Risk  (10/14/2022)   Overall Financial Resource Strain (CARDIA)    Difficulty of Paying Living Expenses: Not hard at all  Food Insecurity: No Food Insecurity (10/14/2022)   Hunger Vital Sign    Worried About Running Out of Food in the Last Year: Never true    Ran Out of Food in the Last Year: Never true  Transportation Needs: No Transportation Needs (10/14/2022)   PRAPARE - Administrator, Civil Service (Medical): No    Lack of Transportation (Non-Medical): No  Physical Activity: Insufficiently Active (10/14/2022)   Exercise Vital Sign    Days of Exercise per Week: 3 days    Minutes of Exercise per Session: 40 min  Stress: No Stress Concern Present (10/14/2022)   [employer] of Occupational Health - Occupational Stress Questionnaire    Feeling of Stress : Only a little  Social Connections: Moderately Isolated (10/14/2022)   Social Connection and Isolation Panel [NHANES]    Frequency of Communication with Friends and Family: Three times a week    Frequency of Social Gatherings with Friends and Family: Once a week  Attends Religious Services: More than 4 times per year    Active Member of Clubs or Organizations: No    Attends Banker Meetings: Never    Marital Status: Never married  Intimate Partner Violence: Not At Risk (03/21/2022)   Humiliation, Afraid, Rape, and Kick questionnaire    Fear of Current or Ex-Partner: No    Emotionally Abused: No    Physically Abused: No    Sexually Abused: No    Outpatient Medications Prior to Visit  Medication Sig Dispense Refill   etonogestrel (NEXPLANON) 68 MG IMPL implant Inject 1 each into the skin once.     megestrol (MEGACE) 40 MG tablet TAKE 1 TABLET BY MOUTH ONCE DAILY FOR  SPOTTING 30 tablet 0   valACYclovir (VALTREX) 1000 MG tablet Take 1 tablet by mouth once daily 30 tablet 11   Vitamin D, Ergocalciferol, (DRISDOL) 1.25 MG (50000 UNIT) CAPS capsule Take 1 capsule by mouth  once a week 10 capsule 0   No facility-administered medications prior to visit.    Allergies  Allergen Reactions   Tangerine Flavor Itching and Rash    ROS Review of Systems  Constitutional:  Negative for chills and fever.  Eyes:  Negative for visual disturbance.  Respiratory:  Negative for chest tightness and shortness of breath.   Neurological:  Negative for dizziness and headaches.      Objective:    Physical Exam HENT:     Head: Normocephalic.     Mouth/Throat:     Mouth: Mucous membranes are moist.  Cardiovascular:     Rate and Rhythm: Normal rate.     Heart sounds: Normal heart sounds.  Pulmonary:     Effort: Pulmonary effort is normal.     Breath sounds: Normal breath sounds.  Neurological:     Mental Status: She is alert.     BP 134/77   Pulse 73   Ht 5\' 2"  (1.575 m)   Wt 155 lb (70.3 kg)   SpO2 97%   BMI 28.35 kg/m  Wt Readings from Last 3 Encounters:  10/15/22 155 lb (70.3 kg)  07/01/22 151 lb 0.6 oz (68.5 kg)  04/18/22 140 lb (63.5 kg)    Lab Results  Component Value Date   TSH 0.997 07/03/2022   Lab Results  Component Value Date   WBC 10.7 07/03/2022   HGB 13.9 07/03/2022   HCT 42.2 07/03/2022   MCV 95 07/03/2022   PLT 270 07/03/2022   Lab Results  Component Value Date   NA 139 07/03/2022   K 4.1 07/03/2022   CO2 21 07/03/2022   GLUCOSE 88 07/03/2022   BUN 9 07/03/2022   CREATININE 0.82 07/03/2022   BILITOT 1.1 07/03/2022   ALKPHOS 74 07/03/2022   AST 17 07/03/2022   ALT 12 07/03/2022   PROT 6.3 07/03/2022   ALBUMIN 3.9 (L) 07/03/2022   CALCIUM 9.5 07/03/2022   EGFR 100 07/03/2022   Lab Results  Component Value Date   CHOL 125 07/03/2022   Lab Results  Component Value Date   HDL 35 (L) 07/03/2022   Lab Results  Component Value Date   LDLCALC 74 07/03/2022   Lab Results  Component Value Date   TRIG 79 07/03/2022   Lab Results  Component Value Date   CHOLHDL 3.6 07/03/2022   Lab Results  Component Value Date    HGBA1C 5.0 07/03/2022      Assessment & Plan:  Attention deficit Assessment & Plan: C/o lack of concentration  which is affecting her job Chronic compliant Would like to be  evaluated Will first rule out vitamins and minerals deficiency Provided resources for neuropsychiatric testing to r/o ADD or to get a definite diagnosis   Orders: -     B12 and Folate Panel -     Magnesium -     Zinc -     VITAMIN D 25 Hydroxy (Vit-D Deficiency, Fractures) -     CBC  Note: This chart has been completed using Engineer, civil (consulting) software, and while attempts have been made to ensure accuracy, certain words and phrases may not be transcribed as intended.    Follow-up: No follow-ups on file.   Gilmore Laroche, FNP

## 2022-10-15 NOTE — Assessment & Plan Note (Addendum)
C/o lack of concentration which is affecting her job Chronic compliant Would like to be  evaluated Will first rule out vitamins and minerals deficiency Provided resources for neuropsychiatric testing to r/o ADD or to get a definite diagnosis

## 2022-10-21 ENCOUNTER — Other Ambulatory Visit: Payer: Self-pay | Admitting: Family Medicine

## 2022-10-21 ENCOUNTER — Other Ambulatory Visit: Payer: Self-pay | Admitting: Adult Health

## 2022-10-21 DIAGNOSIS — E559 Vitamin D deficiency, unspecified: Secondary | ICD-10-CM

## 2022-10-21 MED ORDER — VITAMIN D (ERGOCALCIFEROL) 1.25 MG (50000 UNIT) PO CAPS
50000.0000 [IU] | ORAL_CAPSULE | ORAL | 0 refills | Status: DC
Start: 2022-10-21 — End: 2022-12-30

## 2022-10-21 MED ORDER — FLUCONAZOLE 150 MG PO TABS: ORAL_TABLET | ORAL | 1 refills | Status: DC

## 2022-10-21 NOTE — Progress Notes (Signed)
Rx diflucan.  

## 2022-12-02 ENCOUNTER — Ambulatory Visit: Payer: 59 | Admitting: Family Medicine

## 2022-12-15 ENCOUNTER — Other Ambulatory Visit: Payer: Self-pay

## 2022-12-15 ENCOUNTER — Encounter: Payer: Self-pay | Admitting: Family Medicine

## 2022-12-15 DIAGNOSIS — R41840 Attention and concentration deficit: Secondary | ICD-10-CM

## 2022-12-15 DIAGNOSIS — F419 Anxiety disorder, unspecified: Secondary | ICD-10-CM

## 2022-12-18 ENCOUNTER — Other Ambulatory Visit: Payer: Self-pay

## 2022-12-18 DIAGNOSIS — R41840 Attention and concentration deficit: Secondary | ICD-10-CM

## 2022-12-18 DIAGNOSIS — Z79899 Other long term (current) drug therapy: Secondary | ICD-10-CM

## 2022-12-18 DIAGNOSIS — F32A Depression, unspecified: Secondary | ICD-10-CM

## 2022-12-29 ENCOUNTER — Other Ambulatory Visit: Payer: Self-pay | Admitting: Family Medicine

## 2022-12-29 DIAGNOSIS — E559 Vitamin D deficiency, unspecified: Secondary | ICD-10-CM

## 2023-01-01 ENCOUNTER — Encounter: Payer: Self-pay | Admitting: Neurology

## 2023-01-01 ENCOUNTER — Ambulatory Visit (INDEPENDENT_AMBULATORY_CARE_PROVIDER_SITE_OTHER): Payer: 59 | Admitting: Neurology

## 2023-01-01 VITALS — BP 130/81 | HR 84 | Ht 62.0 in | Wt 151.0 lb

## 2023-01-01 DIAGNOSIS — G8929 Other chronic pain: Secondary | ICD-10-CM

## 2023-01-01 DIAGNOSIS — R519 Headache, unspecified: Secondary | ICD-10-CM

## 2023-01-01 DIAGNOSIS — M7918 Myalgia, other site: Secondary | ICD-10-CM

## 2023-01-01 DIAGNOSIS — M6289 Other specified disorders of muscle: Secondary | ICD-10-CM

## 2023-01-01 DIAGNOSIS — R51 Headache with orthostatic component, not elsewhere classified: Secondary | ICD-10-CM

## 2023-01-01 DIAGNOSIS — G43009 Migraine without aura, not intractable, without status migrainosus: Secondary | ICD-10-CM | POA: Diagnosis not present

## 2023-01-01 DIAGNOSIS — M542 Cervicalgia: Secondary | ICD-10-CM | POA: Diagnosis not present

## 2023-01-01 DIAGNOSIS — M2170 Unequal limb length (acquired), unspecified site: Secondary | ICD-10-CM | POA: Diagnosis not present

## 2023-01-01 DIAGNOSIS — M5432 Sciatica, left side: Secondary | ICD-10-CM

## 2023-01-01 DIAGNOSIS — R269 Unspecified abnormalities of gait and mobility: Secondary | ICD-10-CM | POA: Diagnosis not present

## 2023-01-01 DIAGNOSIS — R5383 Other fatigue: Secondary | ICD-10-CM

## 2023-01-01 DIAGNOSIS — H539 Unspecified visual disturbance: Secondary | ICD-10-CM

## 2023-01-01 DIAGNOSIS — R29898 Other symptoms and signs involving the musculoskeletal system: Secondary | ICD-10-CM

## 2023-01-01 DIAGNOSIS — G379 Demyelinating disease of central nervous system, unspecified: Secondary | ICD-10-CM

## 2023-01-01 MED ORDER — AMPHETAMINE-DEXTROAMPHETAMINE 10 MG PO TABS: 10.0000 mg | ORAL_TABLET | Freq: Two times a day (BID) | ORAL | 0 refills | Status: DC

## 2023-01-01 NOTE — Patient Instructions (Addendum)
Flexeril at bedtime 5mg   Botox in the masseters Dry Needling in the cervical muscles Occipital nerve blocks NTI splinting- talk to your dentist Allay lamp and theraspects - will bring Monday Adderall ER  Effexor or  Cymbalta or Celexa - can help with mood and migraine Migraine prevention: see below Migraine Acute management: see below  Options for prevention: As above, cymbalta or effexor (Celexa) Propranolol 10mg  twice daily - antianxiety, help with palpitations, really good migraine medication Qulipta   Acute Management Nurtec   ADHD:  Adderall   MRI of the brain and cervical spine

## 2023-01-01 NOTE — Progress Notes (Addendum)
[facility]    Provider:  Dr Lucia Gaskins Requesting Provider: Gilmore Laroche, FNP Primary Care Provider:  Gilmore Laroche, FNP  CC:  Migraines, neck pain,   HPI:  Karen Moss is a 27 y.o. female here as requested by Gilmore Laroche, FNP for migraines, neck pain, ADHD. has MDD (major depressive disorder), single episode, moderate (HCC); Nexplanon insertion; Chlamydia; Encounter for well woman exam with routine gynecological exam; Family planning; Screening examination for STD (sexually transmitted disease); Nexplanon in place; Wax in ear; Encounter for removal and reinsertion of Nexplanon; Pregnancy examination or test, negative result; Encounter for gynecological examination with Papanicolaou smear of cervix; Depression; Irregular intermenstrual bleeding; Papanicolaou smear of cervix with positive high risk human papilloma virus (HPV) test; Anxiety and depression; ASCUS with positive high risk HPV cervical; Encounter for annual general medical examination with abnormal findings in adult; Right wrist tendinitis; Attention deficit; Migraine without aura and without status migrainosus, not intractable; Cervicalgia; Cervical myofascial pain syndrome; and Chronic neck pain on their problem list.  Migraines started in 2021 when her father became ill and there was stress. This was the father who raised her. Clenches a lot. She has a guard. She cracked a tooth she clenches so hrad. She has had migraines so bad she cried. The headaches are in the pack of her head, tingling and sizzling pain in the occipital distribution, sizzling burning pain. She has headaches in the temples. Pulsating/pounding/throbbing with lught and sound sensitivity, nausea, hurts to move, nothing has made it better, stress is a trigger. For the last  year worsening currently 12 total headache days a month and 6 migraine days a month. Was held back in 2nd grade due to adhd and then failed 2nd grade. Has impulse  control. Also significa neck pain, creptus, decreased ROM, vision changes, positional headaches, worsening headaches, needs a thorough evaluation and treatment.   Patient also reports pain in the low back with occasional zings and radicular pain down her leg and left trapezius pain that has responded well to nerve blocks. The left trapezius pain triggers her migraines as well. She has left hip drop and left shoulder drop, left pain on palpation of trapezius and left lower paraspinal muscles.     No other focal neurologic deficits, associated symptoms, inciting events or modifiable factors.  Reviewed notes, labs and imaging from outside physicians, which showed:     Latest Ref Rng & Units 01/26/2023   12:35 PM 10/15/2022    3:26 PM 07/03/2022    8:03 AM  CBC  WBC 3.4 - 10.8 x10E3/uL 14.2  15.4  10.7   Hemoglobin 11.1 - 15.9 g/dL 61.6  07.3  71.0   Hematocrit 34.0 - 46.6 % 46.1  42.1  42.2   Platelets 150 - 450 x10E3/uL 324  303  270    CBC    Component Value Date/Time   WBC 14.2 (H) 01/26/2023 1235   WBC 11.1 (H) 08/20/2013 1656   RBC 4.80 01/26/2023 1235   RBC 4.55 08/20/2013 1656   HGB 15.2 01/26/2023 1235   HCT 46.1 01/26/2023 1235   PLT 324 01/26/2023 1235   MCV 96 01/26/2023 1235   MCH 31.7 01/26/2023 1235   MCH 32.1 08/20/2013 1656   MCHC 33.0 01/26/2023 1235   MCHC 36.0 08/20/2013 1656   RDW 11.6 (L) 01/26/2023 1235   LYMPHSABS 2.1 01/26/2023 1235   MONOABS 0.7 08/20/2013 1656   EOSABS 0.0 01/26/2023 1235   BASOSABS 0.1 01/26/2023 1235  Latest Ref Rng & Units 01/26/2023   12:35 PM 07/03/2022    8:03 AM 07/11/2021   10:13 AM  CMP  Glucose 70 - 99 mg/dL 86  88  89   BUN 6 - 20 mg/dL 11  9  12    Creatinine 0.57 - 1.00 mg/dL 4.09  8.11  9.14   Sodium 134 - 144 mmol/L 138  139  141   Potassium 3.5 - 5.2 mmol/L 4.8  4.1  4.2   Chloride 96 - 106 mmol/L 99  104  105   CO2 20 - 29 mmol/L 21  21  22    Calcium 8.7 - 10.2 mg/dL 78.2  9.5  95.6   Total Protein 6.0  - 8.5 g/dL 7.7  6.3  7.0   Total Bilirubin 0.0 - 1.2 mg/dL 1.0  1.1  1.5   Alkaline Phos 44 - 121 IU/L 89  74  63   AST 0 - 40 IU/L 18  17  16    ALT 0 - 32 IU/L 15  12  10        Latest Ref Rng & Units 01/26/2023   12:35 PM 10/15/2022    3:26 PM 07/03/2022    8:03 AM  CBC  WBC 3.4 - 10.8 x10E3/uL 14.2  15.4  10.7   Hemoglobin 11.1 - 15.9 g/dL 21.3  08.6  57.8   Hematocrit 34.0 - 46.6 % 46.1  42.1  42.2   Platelets 150 - 450 x10E3/uL 324  303  270       Medications tried > 3 months from a thoruogh review of records: Topiramate, Amitriptyline, Celexa, Nurtec, propranolol, botox for migraines, Ajovy, celexa, decadron, ibuprofe, zofran, trazodone, tylenol, ibuprofen,    Review of Systems: Patient complains of symptoms per HPI as well as the following symptoms stress, depression, anxiety. Pertinent negatives and positives per HPI. All others negative.   Social History   Socioeconomic History   Marital status: Single    Spouse name: Not on file   Number of children: Not on file   Years of education: Not on file   Highest education level: Associate degree: occupational, Scientist, product/process development, or vocational program  Occupational History   Not on file  Tobacco Use   Smoking status: Never   Smokeless tobacco: Never  Vaping Use   Vaping status: Never Used  Substance and Sexual Activity   Alcohol use: Yes    Comment: occ   Drug use: No   Sexual activity: Yes    Birth control/protection: Implant  Other Topics Concern   Not on file  Social History Narrative   Not on file   Social Determinants of Health   Financial Resource Strain: Low Risk  (10/14/2022)   Overall Financial Resource Strain (CARDIA)    Difficulty of Paying Living Expenses: Not hard at all  Food Insecurity: No Food Insecurity (10/14/2022)   Hunger Vital Sign    Worried About Running Out of Food in the Last Year: Never true    Ran Out of Food in the Last Year: Never true  Transportation Needs: No Transportation Needs  (10/14/2022)   PRAPARE - Administrator, Civil Service (Medical): No    Lack of Transportation (Non-Medical): No  Physical Activity: Insufficiently Active (10/14/2022)   Exercise Vital Sign    Days of Exercise per Week: 3 days    Minutes of Exercise per Session: 40 min  Stress: No Stress Concern Present (10/14/2022)   [employer] of Occupational Health - Occupational  Stress Questionnaire    Feeling of Stress : Only a little  Social Connections: Moderately Isolated (10/14/2022)   Social Connection and Isolation Panel [NHANES]    Frequency of Communication with Friends and Family: Three times a week    Frequency of Social Gatherings with Friends and Family: Once a week    Attends Religious Services: More than 4 times per year    Active Member of Clubs or Organizations: No    Attends Banker Meetings: Never    Marital Status: Never married  Intimate Partner Violence: Not At Risk (03/21/2022)   Humiliation, Afraid, Rape, and Kick questionnaire    Fear of Current or Ex-Partner: No    Emotionally Abused: No    Physically Abused: No    Sexually Abused: No    Family History  Problem Relation Age of Onset   Hypertension Mother    Stroke Mother    Heart disease Father    Cancer Father 65       prostate   Cancer Maternal Grandmother        lung   Cancer Maternal Grandfather        brain    Past Medical History:  Diagnosis Date   ASCUS with positive high risk HPV cervical 03/13/2021   03/13/21 will get colpo per [facility]  5 year CIN 3+ risk is 5.4 %   Chlamydia 12/30/2016   Medical history non-contributory    Papanicolaou smear of cervix with positive high risk human papilloma virus (HPV) test 03/06/2020   02/2020 repeat in 1 year with HVP testing, [per [facility] 5 year risk CIN3 + 4.8 %    Patient Active Problem List   Diagnosis Date Noted   Migraine without aura and without status migrainosus, not intractable 01/04/2023   Cervicalgia 01/04/2023   Cervical  myofascial pain syndrome 01/04/2023   Chronic neck pain 01/04/2023   Attention deficit 10/15/2022   Right wrist tendinitis 03/28/2022   Encounter for annual general medical examination with abnormal findings in adult 03/21/2022   ASCUS with positive high risk HPV cervical 03/13/2021   Anxiety and depression 03/01/2021   Papanicolaou smear of cervix with positive high risk human papilloma virus (HPV) test 03/06/2020   Encounter for gynecological examination with Papanicolaou smear of cervix 02/27/2020   Depression 02/27/2020   Irregular intermenstrual bleeding 02/27/2020   Encounter for removal and reinsertion of Nexplanon 01/11/2020   Pregnancy examination or test, negative result 01/11/2020   Encounter for well woman exam with routine gynecological exam 02/16/2018   Family planning 02/16/2018   Screening examination for STD (sexually transmitted disease) 02/16/2018   Nexplanon in place 02/16/2018   Wax in ear 02/16/2018   Chlamydia 12/30/2016   Nexplanon insertion 01/03/2014   MDD (major depressive disorder), single episode, moderate (HCC) 08/21/2013    Past Surgical History:  Procedure Laterality Date   REFRACTIVE SURGERY Bilateral    Lasik eye surgery    Current Outpatient Medications  Medication Sig Dispense Refill   amphetamine-dextroamphetamine (ADDERALL) 10 MG tablet Take 1 tablet (10 mg total) by mouth 2 (two) times daily with a meal. 60 tablet 0   Atogepant (QULIPTA) 60 MG TABS Take 1 tablet (60 mg total) by mouth daily. 30 tablet 11   etonogestrel (NEXPLANON) 68 MG IMPL implant Inject 1 each into the skin once.     megestrol (MEGACE) 40 MG tablet TAKE 1 TABLET BY MOUTH ONCE DAILY FOR  SPOTTING 30 tablet 0   Rimegepant Sulfate (NURTEC) 75 MG  TBDP Take 1 tablet (75 mg total) by mouth daily as needed. For migraines. Take as close to onset of migraine as possible. One daily maximum. 16 tablet 11   valACYclovir (VALTREX) 1000 MG tablet Take 1 tablet by mouth once daily 30  tablet 11   Vitamin D, Ergocalciferol, (DRISDOL) 1.25 MG (50000 UNIT) CAPS capsule Take 1 capsule by mouth once a week 10 capsule 0   No current facility-administered medications for this visit.    Allergies as of 01/01/2023 - Review Complete 01/01/2023  Allergen Reaction Noted   Tangerine flavor Itching and Rash 10/15/2011    Vitals: BP 130/81   Pulse 84   Ht 5\' 2"  (1.575 m)   Wt 151 lb (68.5 kg)   BMI 27.62 kg/m  Last Weight:  Wt Readings from Last 1 Encounters:  01/01/23 151 lb (68.5 kg)   Last Height:   Ht Readings from Last 1 Encounters:  01/01/23 5\' 2"  (1.575 m)     Physical exam: Exam: Gen: NAD, conversant, well nourised, well groomed                     CV: RRR, no MRG. No Carotid Bruits. No peripheral edema, warm, nontender Eyes: Conjunctivae clear without exudates or hemorrhage MSK: left hip drop and left shoulder drop, left pain on palpation of trapezius and left lower paraspinal muscles.  Neuro: Detailed Neurologic Exam  Speech:    Speech is normal; fluent and spontaneous with normal comprehension.  Cognition:    The patient is oriented to person, place, and time;     recent and remote memory intact;     language fluent;     normal attention, concentration,     fund of knowledge Cranial Nerves:    The pupils are equal, round, and reactive to light. The fundi are normal and spontaneous venous pulsations are present. Visual fields are full to finger confrontation. Extraocular movements are intact. Trigeminal sensation is intact and the muscles of mastication are normal. The face is symmetric. The palate elevates in the midline. Hearing intact. Voice is normal. Shoulder shrug is normal. The tongue has normal motion without fasciculations.   Coordination:    Normal finger to nose and heel to shin. Normal rapid alternating movements.   Gait: Impaired, leg length discrepancy  Motor Observation:    No asymmetry, no atrophy, and no involuntary movements  noted. Tone:    Normal muscle tone.    Posture:    Posture is normal. normal erect    Strength: Upper proximal extremity weakness other intact     Sensation: intact to LT     Reflex Exam:  DTR's:    Deep tendon reflexes in the upper and lower extremities are normal bilaterally.   Toes:    The toes are downgoing bilaterally.   Clonus:    Clonus is absent.    Assessment/Plan:  Lovely 27 year old with migraines, cervical myofascial pain syndrome, ADHD, bruxism, left trap and left hip drop. Patient also reports pain in the low back with occasional zings and radicular pain down her leg and left trapezius pain that has responded well to nerve blocks. The left trapezius pain triggers her migraines as well. She has left hip drop and left shoulder drop, left pain on palpation of trapezius and left lower paraspinal muscles.   She has left hip drop and left shoulder drop, left pain on palpation of trapezius and left lower paraspinal muscles: Very healthy lovely 27 year old,  She has what appears to be left hip drop and left shoulder drop, left pain on palpation of trapezius and left lower paraspinal muscles: Much appreciate my colleague Dr. Terrilee Files whom my patients speak extremely highely of ! I will ask my colleague to evaluate for Left cervical myofascial pain(left trap pain and tenderness to palpation) maybe due to leg length discrepancy on the left causing a lot of misalignment and left drop shoulder and drop hip and irritation of the L5/S1 vs SI Joint pain? Anything Dr, Katrinka Blazing can do to help with myofascial release and helping with hip realignment or other musculoskeletal advice would be greatly appreciated! MRI Lumbar spine normal. Pending MRI brain and cervical spine which I also suspect will be normal. Responded to cervical nerve blocks very well but need a more permanent solution. Much appreciate our colleague Dr. Katrinka Blazing!  Flexeril at bedtime 5mg : for bruxism Botox in the masseters: will see  if we can get it approved Dry Needling in the cervical muscles : will order Occipital nerve blocks: today NTI splinting- talk to your dentist Allay lamp and theraspects - will bring Monday to show her, discussed managing light sensitivity Adderall ER : ADHD Effexor or  Cymbalta or Celexa - can help with mood and migraine, she is seeing psychiatry and therapy please discuss Migraine prevention: see below Migraine Acute management: see below  Options for prevention: As above, cymbalta or effexor (Celexa) Propranolol 10mg  twice daily - antianxiety, help with palpitations, really good migraine medication Qulipta: will order  Acute Management Nurtec   ADHD:  Adderall   MRI of the brain and cervical spine: MRI brain due to concerning symptoms of morning headaches, positional and exertional headaches,vision changes, worsening headaches  to look for space occupying mass, chiari or intracranial hypertension (pseudotumor), strokes, malignancies, vasculidities, demyelination(multiple sclerosis) or other. Ben under the care of physyicns for > 6 months, conservative measures failed, need to evaluate for MS especially in a young female with multiple neurologic symptoms.   Orders Placed This Encounter  Procedures   MR BRAIN W WO CONTRAST   MR CERVICAL SPINE W WO CONTRAST   Comprehensive metabolic panel   CBC with Differential/Platelets   TSH   Vitamin D, 25-hydroxy   B12 and Folate Panel   POCT urine pregnancy   Meds ordered this encounter  Medications   amphetamine-dextroamphetamine (ADDERALL) 10 MG tablet    Sig: Take 1 tablet (10 mg total) by mouth 2 (two) times daily with a meal.    Dispense:  60 tablet    Refill:  0   Atogepant (QULIPTA) 60 MG TABS    Sig: Take 1 tablet (60 mg total) by mouth daily.    Dispense:  30 tablet    Refill:  11   Rimegepant Sulfate (NURTEC) 75 MG TBDP    Sig: Take 1 tablet (75 mg total) by mouth daily as needed. For migraines. Take as close to onset of  migraine as possible. One daily maximum.    Dispense:  16 tablet    Refill:  11   Patient was consented for bilateral occipital and trigeminal nerve blocks. A solution containing 0.5% Bupivacaine 8-cc was prepared in 3 3-CC syringes with 27 gauge 1/2 inch needle.   6 Target areas in the suboccipital and  were identified via palpation and pain response.The sites junctions were sterilized with alcohol wipes. 1ml was injected at each site. The contents of each syringe was injected in a fanlike fashion on each side. The headache improved  from 10/10 to 0/10. Patient tolerated the procedure well and no complications were noted.    What to expect afterwards?  Immediately after the injection, the back of your head may feel warm and numb. You may also experience reduction in the pain. The local anaesthetic wears off in a few hours and the steroid usually takes  3-7 days to take effect.   The pain relief is vary variable and can last from a few days to several months. Some patients do not experience any pain relief. Hence it is difficult to predict the outcome of the injection treatment in a particular patient.   There may be some discomfort at the injection site for a couple of days after treatment, however, this should settle quite quickly. We advise you to take things easy for the rest of the day. Continue taking your pain medication as advised by your consultant or until you feel benefit from the treatment.   What are the side effects / complications?  Common   Soreness / bruising at the injection site.   Temporary increase (up to 7 days) in pain following procedure.   Rare   Bleeding   Infection at the injection site   Allergic reaction   New pain   Worsening pain   Cc: Gilmore Laroche, FNP,  Gilmore Laroche, FNP  Naomie Dean, MD  [facility] [address] [place], [place] [address]  Phone [phone] Fax [phone]  I spent over 60  minutes of face-to-face and non-face-to-face time with patient on the  1. Migraine without aura and without status migrainosus, not intractable   2. Cervicalgia   3. Leg length discrepancy   4. Chronic neck pain   5. Abnormality of gait   6. Proximal weakness of extremity   7. Arm weakness   8. evaluate for Demyelinating disease (HCC)   9. Visual changes   10. Worsening headaches   11. Positional headache   12. Occipital headache   13. Other fatigue   14. Cervical myofascial pain syndrome    diagnosis.  This included previsit chart review, lab review, study review, order entry, electronic health record documentation, patient education on the different diagnostic and therapeutic options, counseling and coordination of care, risks and benefits of management, compliance, or risk factor reduction. This does not include time for the nerve blocks.

## 2023-01-04 ENCOUNTER — Encounter: Payer: Self-pay | Admitting: Neurology

## 2023-01-04 DIAGNOSIS — M542 Cervicalgia: Secondary | ICD-10-CM | POA: Insufficient documentation

## 2023-01-04 DIAGNOSIS — M7918 Myalgia, other site: Secondary | ICD-10-CM | POA: Insufficient documentation

## 2023-01-04 DIAGNOSIS — G8929 Other chronic pain: Secondary | ICD-10-CM | POA: Insufficient documentation

## 2023-01-04 DIAGNOSIS — G43009 Migraine without aura, not intractable, without status migrainosus: Secondary | ICD-10-CM | POA: Insufficient documentation

## 2023-01-04 MED ORDER — NURTEC 75 MG PO TBDP: 75.0000 mg | ORAL_TABLET | Freq: Every day | ORAL | 11 refills | Status: AC | PRN

## 2023-01-04 MED ORDER — QULIPTA 60 MG PO TABS: 60.0000 mg | ORAL_TABLET | Freq: Every day | ORAL | 11 refills | Status: DC

## 2023-01-05 ENCOUNTER — Ambulatory Visit (INDEPENDENT_AMBULATORY_CARE_PROVIDER_SITE_OTHER): Payer: 59 | Admitting: Neurology

## 2023-01-05 ENCOUNTER — Telehealth: Payer: Self-pay

## 2023-01-05 DIAGNOSIS — G43009 Migraine without aura, not intractable, without status migrainosus: Secondary | ICD-10-CM

## 2023-01-05 DIAGNOSIS — M5481 Occipital neuralgia: Secondary | ICD-10-CM | POA: Diagnosis not present

## 2023-01-05 DIAGNOSIS — M7918 Myalgia, other site: Secondary | ICD-10-CM

## 2023-01-05 MED ORDER — METHYLPREDNISOLONE ACETATE 80 MG/ML IJ SUSP
80.0000 mg | Freq: Once | INTRAMUSCULAR | Status: AC
  Administered 2023-01-05: 80 mg via INTRAMUSCULAR

## 2023-01-05 MED ORDER — LIDOCAINE HCL 2 % IJ SOLN: 2.0000 mL | Freq: Once | INTRAMUSCULAR | Status: DC

## 2023-01-05 MED ORDER — BUPIVACAINE HCL 0.25 % IJ SOLN: 6.0000 mL | Freq: Once | INTRAMUSCULAR | Status: DC

## 2023-01-05 NOTE — Telephone Encounter (Signed)
Qulipta 60mg  (Key: [id number] The request has been approved. The authorization is effective from 01/05/2023 to 07/06/2023, as long as the member is enrolled in their current health plan. Pt has been notified.

## 2023-01-05 NOTE — Telephone Encounter (Signed)
Nurtec 75mg  (Key: [id number]). Your information has been sent to [employer]. Awaiting determination.

## 2023-01-05 NOTE — Addendum Note (Signed)
Addended by: Naomie Dean B on: 01/05/2023 01:30 PM   Modules accepted: Orders

## 2023-01-06 ENCOUNTER — Ambulatory Visit (INDEPENDENT_AMBULATORY_CARE_PROVIDER_SITE_OTHER): Payer: 59

## 2023-01-06 DIAGNOSIS — M6289 Other specified disorders of muscle: Secondary | ICD-10-CM | POA: Diagnosis not present

## 2023-01-06 DIAGNOSIS — R269 Unspecified abnormalities of gait and mobility: Secondary | ICD-10-CM

## 2023-01-06 DIAGNOSIS — M2170 Unequal limb length (acquired), unspecified site: Secondary | ICD-10-CM | POA: Diagnosis not present

## 2023-01-06 DIAGNOSIS — M5432 Sciatica, left side: Secondary | ICD-10-CM | POA: Diagnosis not present

## 2023-01-06 NOTE — Telephone Encounter (Signed)
Approved on October 21 by [employer] 2017 The request has been approved. The authorization is effective from 01/05/2023 to 07/04/2023, as long as the member is enrolled in their current health plan. The request was approved as submitted.

## 2023-01-06 NOTE — Addendum Note (Signed)
Addended by: Naomie Dean B on: 01/06/2023 07:27 PM   Modules accepted: Orders

## 2023-01-06 NOTE — Progress Notes (Signed)
Performed by Dr. Lucia Gaskins M.D. All procedures a documented blood were medically necessary, reasonable and appropriate based on the patient's history, medical diagnosis and physician opinion. Verbal informed consent was obtained from the patient, patient was informed of potential risk of procedure, including bruising, bleeding, hematoma formation, infection, muscle weakness, muscle pain, numbness, transient hypertension, transient hyperglycemia and transient insomnia among others. All areas injected were topically clean with isopropyl rubbing alcohol. Nonsterile nonlatex gloves were worn during the procedure.  1. Greater occipital nerve block [phone]). The greater occipital nerve site was identified at the nuchal line medial to the occipital artery. Medication was injected into the left occipital nerve areas and suboccipital areas. Patient's condition is associated with inflammation of the greater occipital nerve and associated multiple groups. Injection was deemed medically necessary, reasonable and appropriate. Injection represents a separate and unique surgical service.  2. 20553: left trapezius and multi-level paraspinal injections (>3 levels)   Patient's pain reduced to 0/10

## 2023-01-07 ENCOUNTER — Other Ambulatory Visit: Payer: Self-pay | Admitting: Neurology

## 2023-01-07 MED ORDER — ALPRAZOLAM 0.25 MG PO TABS
0.2500 mg | ORAL_TABLET | Freq: Three times a day (TID) | ORAL | 1 refills | Status: DC | PRN
Start: 2023-01-07 — End: 2023-01-23

## 2023-01-14 ENCOUNTER — Ambulatory Visit (INDEPENDENT_AMBULATORY_CARE_PROVIDER_SITE_OTHER): Payer: Self-pay

## 2023-01-14 DIAGNOSIS — R269 Unspecified abnormalities of gait and mobility: Secondary | ICD-10-CM

## 2023-01-14 DIAGNOSIS — G379 Demyelinating disease of central nervous system, unspecified: Secondary | ICD-10-CM

## 2023-01-14 DIAGNOSIS — R51 Headache with orthostatic component, not elsewhere classified: Secondary | ICD-10-CM

## 2023-01-14 DIAGNOSIS — R29898 Other symptoms and signs involving the musculoskeletal system: Secondary | ICD-10-CM | POA: Diagnosis not present

## 2023-01-14 DIAGNOSIS — M542 Cervicalgia: Secondary | ICD-10-CM | POA: Diagnosis not present

## 2023-01-14 DIAGNOSIS — M6289 Other specified disorders of muscle: Secondary | ICD-10-CM

## 2023-01-14 DIAGNOSIS — R519 Headache, unspecified: Secondary | ICD-10-CM | POA: Diagnosis not present

## 2023-01-14 DIAGNOSIS — G8929 Other chronic pain: Secondary | ICD-10-CM | POA: Diagnosis not present

## 2023-01-14 DIAGNOSIS — H539 Unspecified visual disturbance: Secondary | ICD-10-CM | POA: Diagnosis not present

## 2023-01-14 MED ORDER — GADOBENATE DIMEGLUMINE 529 MG/ML IV SOLN
15.0000 mL | Freq: Once | INTRAVENOUS | Status: AC | PRN
  Administered 2023-01-14: 15 mL via INTRAVENOUS

## 2023-01-16 ENCOUNTER — Ambulatory Visit: Payer: 59 | Admitting: Adult Health

## 2023-01-16 ENCOUNTER — Encounter: Payer: Self-pay | Admitting: Adult Health

## 2023-01-16 VITALS — BP 128/81 | HR 59 | Ht 62.0 in | Wt 145.0 lb

## 2023-01-16 DIAGNOSIS — Z3046 Encounter for surveillance of implantable subdermal contraceptive: Secondary | ICD-10-CM | POA: Diagnosis not present

## 2023-01-16 DIAGNOSIS — Z3202 Encounter for pregnancy test, result negative: Secondary | ICD-10-CM | POA: Diagnosis not present

## 2023-01-16 LAB — POCT URINE PREGNANCY: Preg Test, Ur: NEGATIVE

## 2023-01-16 MED ORDER — MEGESTROL ACETATE 40 MG PO TABS
40.0000 mg | ORAL_TABLET | Freq: Every day | ORAL | 1 refills | Status: AC
  Filled 2023-02-23: qty 60, 60d supply, fill #0
  Filled 2023-08-19: qty 30, 30d supply, fill #1

## 2023-01-16 MED ORDER — ETONOGESTREL 68 MG ~~LOC~~ IMPL
68.0000 mg | DRUG_IMPLANT | Freq: Once | SUBCUTANEOUS | Status: AC
  Administered 2023-01-16: 68 mg via SUBCUTANEOUS

## 2023-01-16 MED ORDER — ONDANSETRON HCL 4 MG PO TABS: 4.0000 mg | ORAL_TABLET | Freq: Three times a day (TID) | ORAL | 1 refills | Status: DC | PRN

## 2023-01-16 NOTE — Patient Instructions (Signed)
Use condoms x 2 weeks, keep clean and dry x 24 hours, no heavy lifting, keep steri strips on x 72 hours, Keep pressure dressing on x 24 hours. Follow up prn problems.  

## 2023-01-16 NOTE — Progress Notes (Signed)
Subjective:     Patient ID: Karen Moss, female   DOB: [birth year], 27 y.o.   MRN: [id number]  HPI Karen Moss is a 27 year old black female,single, G0P0 in for nexplanon removal and reinsertion. She requests refills on zofran and megace.     Component Value Date/Time   DIAGPAP  03/21/2022 1207    - Negative for Intraepithelial Lesions or Malignancy (NILM)   DIAGPAP - Benign reactive/reparative changes 03/21/2022 1207   DIAGPAP (A) 03/01/2021 1040    - Atypical squamous cells of undetermined significance (ASC-US)   HPVHIGH Positive (A) 03/21/2022 1207   HPVHIGH Positive (A) 03/01/2021 1040   HPVHIGH Positive (A) 02/27/2020 1029   ADEQPAP  03/21/2022 1207    Satisfactory for evaluation; transformation zone component PRESENT.   ADEQPAP  03/01/2021 1040    Satisfactory for evaluation; transformation zone component PRESENT.   ADEQPAP  02/27/2020 1029    Satisfactory for evaluation; transformation zone component PRESENT.    PCP is Gilmore Laroche NP  Review of Systems For nexplanon removal and reinsertion Reviewed past medical,surgical, social and family history. Reviewed medications and allergies.     Objective:   Physical Exam BP 128/81 (BP Location: Left Arm, Patient Position: Sitting, Cuff Size: Normal)   Pulse (!) 59   Ht 5\' 2"  (1.575 m)   Wt 145 lb (65.8 kg)   LMP 12/30/2022 (Approximate)   BMI 26.52 kg/m  UPT is negative  Last sex in September Consent signed and time out called. Left arm cleansed with betadine, and injected with 1.5 cc 2% lidocaine and waited til numb.Under sterile technique a #11 blade was used to make small vertical incision, and a curved forceps was used to easily remove rod.Ne wrod inserted and palpated by provider and pt.  Steri strips applied. Pressure dressing applied.    Fall risk Is low  Upstream - 01/16/23 0930       Pregnancy Intention Screening   Does the patient want to become pregnant in the next year? No    Does the patient's  partner want to become pregnant in the next year? No    Would the patient like to discuss contraceptive options today? No      Contraception Wrap Up   Current Method Hormonal Implant    End Method Hormonal Implant    Contraception Counseling Provided Yes             Assessment:     1. Pregnancy examination or test, negative result - POCT urine pregnancy  2. Encounter for removal and reinsertion of Nexplanon Lot [id number] Exp 2026-04 Use condoms x 2 weeks, keep clean and dry x 24 hours, no heavy lifting, keep steri strips on x 72 hours, Keep pressure dressing on x 24 hours. Follow up prn problems.    Remove in 3 years or sooner if desired  Meds ordered this encounter  Medications   ondansetron (ZOFRAN) 4 MG tablet    Sig: Take 1 tablet (4 mg total) by mouth every 8 (eight) hours as needed.    Dispense:  20 tablet    Refill:  1    Order Specific Question:   Supervising Provider    Answer:   Despina Hidden, LUTHER H [[id number]]   megestrol (MEGACE) 40 MG tablet    Sig: TAKE 1 TABLET BY MOUTH ONCE DAILY for spotting    Dispense:  60 tablet    Refill:  1    Order Specific Question:   Supervising Provider    Answer:  EURE, LUTHER H [[id number]]    Plan:     Remove in 3 years or sooner if desired

## 2023-01-16 NOTE — Addendum Note (Signed)
Addended by: Colen Darling on: 01/16/2023 10:13 AM   Modules accepted: Orders

## 2023-01-21 NOTE — Progress Notes (Unsigned)
[facility] [address] [place] [address] Phone: [phone] Subjective:   Bruce Donath, am serving as a scribe for Dr. Antoine Primas.  I'm seeing this patient by the request  of:  Gilmore Laroche, FNP  CC: Pain in different areas especially neck  [id number]  BRIEL GALLICCHIO is a 27 y.o. female coming in with complaint of neck, hip and leg length discrepancy. Patient states that 2 people have told her that she has leg length issue. Told that L hip is lower than the right. Notes sharp pain in R hip at times that will radiate from the glute down the lateral aspect of leg. 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  Also notes having numbness surrounding both SI joints when provider was palpating that area.  Patient has had multiple medications as well as Botox injections that were helpful initially.  Also clenching teeth that is causing tension in back of head and neck on L side. Sees provider for migraines. Also c/o L shoulder pain related to her neck pain. Feels burning sensation in the head and neck.   Tries to do butterfly and other yoga poses to help with her pain.    MRI of the lumbar spine was independently visualized by me showing no significant abnormality cervical MRI did show very mild protruding disc at C5-C6 but otherwise fairly unremarkable.  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  Past Medical History:  Diagnosis Date   ASCUS with positive high risk HPV cervical 03/13/2021   03/13/21 will get colpo per [facility]  5 year CIN 3+ risk is 5.4 %   Chlamydia 12/30/2016   Medical history non-contributory    Papanicolaou smear of cervix with positive high risk human papilloma virus (HPV) test 03/06/2020   02/2020 repeat in 1 year with HVP testing, [per [facility] 5 year risk CIN3 + 4.8 %   Past Surgical History:  Procedure Laterality Date   REFRACTIVE SURGERY Bilateral    Lasik eye  surgery   Social History   Socioeconomic History   Marital status: Single    Spouse name: Not on file   Number of children: Not on file   Years of education: Not on file   Highest education level: Associate degree: occupational, Scientist, product/process development, or vocational program  Occupational History   Not on file  Tobacco Use   Smoking status: Never   Smokeless tobacco: Never  Vaping Use   Vaping status: Never Used  Substance and Sexual Activity   Alcohol use: Yes    Comment: occ   Drug use: No   Sexual activity: Yes    Birth control/protection: Implant  Other Topics Concern   Not on file  Social History Narrative   Not on file   Social Determinants of Health   Financial Resource Strain: Low Risk  (10/14/2022)   Overall Financial Resource Strain (CARDIA)    Difficulty of Paying Living Expenses: Not hard at all  Food Insecurity: No Food Insecurity (10/14/2022)   Hunger Vital Sign    Worried About Running Out of Food in the Last Year: Never true    Ran Out of Food in the Last Year: Never true  Transportation Needs: No Transportation Needs (10/14/2022)   PRAPARE - Administrator, Civil Service (Medical): No    Lack of Transportation (Non-Medical): No  Physical Activity: Insufficiently Active (10/14/2022)   Exercise Vital Sign  Days of Exercise per Week: 3 days    Minutes of Exercise per Session: 40 min  Stress: No Stress Concern Present (10/14/2022)   [employer] of Occupational Health - Occupational Stress Questionnaire    Feeling of Stress : Only a little  Social Connections: Moderately Isolated (10/14/2022)   Social Connection and Isolation Panel [NHANES]    Frequency of Communication with Friends and Family: Three times a week    Frequency of Social Gatherings with Friends and Family: Once a week    Attends Religious Services: More than 4 times per year    Active Member of [employer] or Organizations: No    Attends Engineer, structural: Never    Marital Status:  Never married   Allergies  Allergen Reactions   Tangerine Flavor Itching and Rash   Family History  Problem Relation Age of Onset   Hypertension Mother    Stroke Mother    Heart disease Father    Cancer Father 37       prostate   Cancer Maternal Grandmother        lung   Cancer Maternal Grandfather        brain    Current Outpatient Medications (Endocrine & Metabolic):    etonogestrel (NEXPLANON) 68 MG IMPL implant, Inject 1 each into the skin once.       Current Outpatient Medications (Analgesics):    Atogepant (QULIPTA) 60 MG TABS, Take 1 tablet (60 mg total) by mouth daily.   Rimegepant Sulfate (NURTEC) 75 MG TBDP, Take 1 tablet (75 mg total) by mouth daily as needed. For migraines. Take as close to onset of migraine as possible. One daily maximum.  Current Facility-Administered Medications (Analgesics):    bupivacaine (MARCAINE) 0.25 % (with pres) injection 6 mL   lidocaine (XYLOCAINE) 2 % (with pres) injection 40 mg    Current Outpatient Medications (Other):    ALPRAZolam (XANAX) 0.25 MG tablet, Take 1 tablet (0.25 mg total) by mouth 3 (three) times daily as needed.   amphetamine-dextroamphetamine (ADDERALL) 10 MG tablet, Take 1 tablet (10 mg total) by mouth 2 (two) times daily with a meal.   megestrol (MEGACE) 40 MG tablet, TAKE 1 TABLET BY MOUTH ONCE DAILY for spotting   ondansetron (ZOFRAN) 4 MG tablet, Take 1 tablet (4 mg total) by mouth every 8 (eight) hours as needed.   valACYclovir (VALTREX) 1000 MG tablet, Take 1 tablet by mouth once daily   Vitamin D, Ergocalciferol, (DRISDOL) 1.25 MG (50000 UNIT) CAPS capsule, Take 1 capsule by mouth once a week    Reviewed prior external information including notes and imaging from  primary care provider As well as notes that were available from care everywhere and other healthcare systems.  Past medical history, social, surgical and family history all reviewed in electronic medical record.  No pertanent information  unless stated regarding to the chief complaint.   Review of Systems:  No  visual changes, nausea, vomiting, diarrhea, constipation, dizziness, abdominal pain, skin rash, fevers, chills, night sweats, weight loss, swollen lymph nodes, body aches, joint swelling, chest pain, shortness of breath, mood changes. POSITIVE muscle aches, headache  Objective  Blood pressure 138/82, pulse 72, height 5\' 2"  (1.575 m), weight 147 lb (66.7 kg), last menstrual period 12/30/2022, SpO2 98%.   General: No apparent distress alert and oriented x3 mood and affect normal, dressed appropriately.  HEENT: Pupils equal, extraocular movements intact  Respiratory: Patient's speak in full sentences and does not appear short of breath  Cardiovascular: No lower extremity edema, non tender, no erythema  Neck exam shows limited sidebending negative spurling, full strength  Tightness faber in the back. Negative SLT   Osteopathic findings C2 flexed rotated and side bent right C4 flexed rotated and side bent left C6 flexed rotated and side bent left T9 extended rotated and side bent left L2 flexed rotated and side bent right L5 flexed rotated and side bent left Sacrum right on right     Impression and Recommendations:     The above documentation has been reviewed and is accurate and complete Judi Saa, DO

## 2023-01-22 ENCOUNTER — Encounter: Payer: Self-pay | Admitting: Family Medicine

## 2023-01-22 ENCOUNTER — Other Ambulatory Visit: Payer: Self-pay | Admitting: Neurology

## 2023-01-22 ENCOUNTER — Ambulatory Visit: Payer: 59 | Admitting: Family Medicine

## 2023-01-22 ENCOUNTER — Other Ambulatory Visit: Payer: Self-pay | Admitting: *Deleted

## 2023-01-22 VITALS — BP 138/82 | HR 72 | Ht 62.0 in | Wt 147.0 lb

## 2023-01-22 DIAGNOSIS — M6289 Other specified disorders of muscle: Secondary | ICD-10-CM

## 2023-01-22 DIAGNOSIS — R269 Unspecified abnormalities of gait and mobility: Secondary | ICD-10-CM

## 2023-01-22 DIAGNOSIS — H539 Unspecified visual disturbance: Secondary | ICD-10-CM

## 2023-01-22 DIAGNOSIS — M2550 Pain in unspecified joint: Secondary | ICD-10-CM

## 2023-01-22 DIAGNOSIS — R51 Headache with orthostatic component, not elsewhere classified: Secondary | ICD-10-CM

## 2023-01-22 DIAGNOSIS — M2170 Unequal limb length (acquired), unspecified site: Secondary | ICD-10-CM

## 2023-01-22 DIAGNOSIS — M9904 Segmental and somatic dysfunction of sacral region: Secondary | ICD-10-CM | POA: Diagnosis not present

## 2023-01-22 DIAGNOSIS — G379 Demyelinating disease of central nervous system, unspecified: Secondary | ICD-10-CM

## 2023-01-22 DIAGNOSIS — M9903 Segmental and somatic dysfunction of lumbar region: Secondary | ICD-10-CM

## 2023-01-22 DIAGNOSIS — M9901 Segmental and somatic dysfunction of cervical region: Secondary | ICD-10-CM | POA: Diagnosis not present

## 2023-01-22 DIAGNOSIS — M542 Cervicalgia: Secondary | ICD-10-CM | POA: Diagnosis not present

## 2023-01-22 DIAGNOSIS — M9902 Segmental and somatic dysfunction of thoracic region: Secondary | ICD-10-CM

## 2023-01-22 DIAGNOSIS — R29898 Other symptoms and signs involving the musculoskeletal system: Secondary | ICD-10-CM

## 2023-01-22 DIAGNOSIS — G8929 Other chronic pain: Secondary | ICD-10-CM | POA: Diagnosis not present

## 2023-01-22 DIAGNOSIS — M9908 Segmental and somatic dysfunction of rib cage: Secondary | ICD-10-CM

## 2023-01-22 DIAGNOSIS — R5383 Other fatigue: Secondary | ICD-10-CM

## 2023-01-22 DIAGNOSIS — G43009 Migraine without aura, not intractable, without status migrainosus: Secondary | ICD-10-CM

## 2023-01-22 DIAGNOSIS — R519 Headache, unspecified: Secondary | ICD-10-CM

## 2023-01-22 NOTE — Assessment & Plan Note (Signed)
Chronic neck pain.  Patient's MRI of the brain does show some signs that is consistent with a Chiari malformation.  Discussed with patient that I do think that there is also a chance though that iron deficiency is playing a role and will get other labs at the same time.  Responded extremely well to osteopathic manipulation.  Discussed which activities to do and which ones to avoid.  Increase activity slowly otherwise.  Follow-up with me again 6 to 8 weeks

## 2023-01-22 NOTE — Patient Instructions (Addendum)
Scapular exercises Iron 65mg  with 500mg  of Vit C CoQ10 200mg  daily Labs with your primary  See me in 6-7 weeks

## 2023-01-22 NOTE — Assessment & Plan Note (Signed)
   Decision today to treat with OMT was based on Physical Exam  After verbal consent patient was treated with HVLA, techniques in cervical, thoracic, rib, lumbar and sacral areas, all areas are chronic   Patient tolerated the procedure well with improvement in symptoms  Patient given exercises, stretches and lifestyle modifications  See medications in patient instructions if given  Patient will follow up in 4-8 weeks

## 2023-01-23 ENCOUNTER — Ambulatory Visit (INDEPENDENT_AMBULATORY_CARE_PROVIDER_SITE_OTHER): Payer: 59 | Admitting: Psychiatry

## 2023-01-23 ENCOUNTER — Encounter (HOSPITAL_COMMUNITY): Payer: Self-pay | Admitting: Psychiatry

## 2023-01-23 DIAGNOSIS — F331 Major depressive disorder, recurrent, moderate: Secondary | ICD-10-CM

## 2023-01-23 DIAGNOSIS — F411 Generalized anxiety disorder: Secondary | ICD-10-CM | POA: Diagnosis not present

## 2023-01-23 DIAGNOSIS — F4310 Post-traumatic stress disorder, unspecified: Secondary | ICD-10-CM | POA: Insufficient documentation

## 2023-01-23 DIAGNOSIS — F4321 Adjustment disorder with depressed mood: Secondary | ICD-10-CM | POA: Diagnosis not present

## 2023-01-23 DIAGNOSIS — F410 Panic disorder [episodic paroxysmal anxiety] without agoraphobia: Secondary | ICD-10-CM | POA: Diagnosis not present

## 2023-01-23 DIAGNOSIS — F4000 Agoraphobia, unspecified: Secondary | ICD-10-CM | POA: Insufficient documentation

## 2023-01-23 DIAGNOSIS — R41840 Attention and concentration deficit: Secondary | ICD-10-CM | POA: Diagnosis not present

## 2023-01-23 DIAGNOSIS — F1021 Alcohol dependence, in remission: Secondary | ICD-10-CM

## 2023-01-23 MED ORDER — CITALOPRAM HYDROBROMIDE 20 MG PO TABS: ORAL_TABLET | ORAL | 1 refills | Status: DC

## 2023-01-23 NOTE — Patient Instructions (Addendum)
We started Celexa 20 mg tablets today. Take half a tablet for 2 weeks.  If tolerating can increase to full tablet daily thereafter.  As we discussed please try to remain abstinent from alcohol as while you are on the stimulant medication this can lower your seizure threshold.  We discontinued the Xanax today as well.  Please begin to look through the DBT (dialectical behavioral therapy) workbook which can be found here: [email or web address]://www.mosley.[email or web address]  This is the most effective treatment for borderline personality disorder.
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
## 2023-01-26 ENCOUNTER — Telehealth: Payer: Self-pay | Admitting: Family Medicine

## 2023-01-26 ENCOUNTER — Other Ambulatory Visit: Payer: Self-pay | Admitting: Family Medicine

## 2023-01-26 DIAGNOSIS — G8929 Other chronic pain: Secondary | ICD-10-CM | POA: Diagnosis not present

## 2023-01-26 DIAGNOSIS — G43009 Migraine without aura, not intractable, without status migrainosus: Secondary | ICD-10-CM | POA: Diagnosis not present

## 2023-01-26 DIAGNOSIS — R29898 Other symptoms and signs involving the musculoskeletal system: Secondary | ICD-10-CM | POA: Diagnosis not present

## 2023-01-26 DIAGNOSIS — G379 Demyelinating disease of central nervous system, unspecified: Secondary | ICD-10-CM | POA: Diagnosis not present

## 2023-01-26 DIAGNOSIS — M542 Cervicalgia: Secondary | ICD-10-CM | POA: Diagnosis not present

## 2023-01-26 DIAGNOSIS — R519 Headache, unspecified: Secondary | ICD-10-CM | POA: Diagnosis not present

## 2023-01-26 DIAGNOSIS — M6289 Other specified disorders of muscle: Secondary | ICD-10-CM | POA: Diagnosis not present

## 2023-01-26 DIAGNOSIS — H539 Unspecified visual disturbance: Secondary | ICD-10-CM | POA: Diagnosis not present

## 2023-01-26 DIAGNOSIS — R269 Unspecified abnormalities of gait and mobility: Secondary | ICD-10-CM | POA: Diagnosis not present

## 2023-01-26 DIAGNOSIS — M2170 Unequal limb length (acquired), unspecified site: Secondary | ICD-10-CM | POA: Diagnosis not present

## 2023-01-26 DIAGNOSIS — M2550 Pain in unspecified joint: Secondary | ICD-10-CM | POA: Diagnosis not present

## 2023-01-26 NOTE — Telephone Encounter (Signed)
Patient called asking if the labs that were ordered by Dr Katrinka Blazing could be released so that she can have them done at a [facility] drawing station?  (She has other labs from another provider as well and wanted to get them done at the same time so she was not charged twice.)

## 2023-01-27 LAB — CBC WITH DIFFERENTIAL/PLATELET
Basophils Absolute: 0.1 10*3/uL (ref 0.0–0.2)
Basos: 1 %
EOS (ABSOLUTE): 0 10*3/uL (ref 0.0–0.4)
Eos: 0 %
Hematocrit: 46.1 % (ref 34.0–46.6)
Hemoglobin: 15.2 g/dL (ref 11.1–15.9)
Immature Grans (Abs): 0.1 10*3/uL (ref 0.0–0.1)
Immature Granulocytes: 1 %
Lymphocytes Absolute: 2.1 10*3/uL (ref 0.7–3.1)
Lymphs: 15 %
MCH: 31.7 pg (ref 26.6–33.0)
MCHC: 33 g/dL (ref 31.5–35.7)
MCV: 96 fL (ref 79–97)
Monocytes Absolute: 0.8 10*3/uL (ref 0.1–0.9)
Monocytes: 5 %
Neutrophils Absolute: 11.1 10*3/uL — ABNORMAL HIGH (ref 1.4–7.0)
Neutrophils: 78 %
Platelets: 324 10*3/uL (ref 150–450)
RBC: 4.8 x10E6/uL (ref 3.77–5.28)
RDW: 11.6 % — ABNORMAL LOW (ref 11.7–15.4)
WBC: 14.2 10*3/uL — ABNORMAL HIGH (ref 3.4–10.8)

## 2023-01-27 LAB — COMPREHENSIVE METABOLIC PANEL
ALT: 15 [IU]/L (ref 0–32)
AST: 18 [IU]/L (ref 0–40)
Albumin: 4.9 g/dL (ref 4.0–5.0)
Alkaline Phosphatase: 89 [IU]/L (ref 44–121)
BUN/Creatinine Ratio: 11 (ref 9–23)
BUN: 11 mg/dL (ref 6–20)
Bilirubin Total: 1 mg/dL (ref 0.0–1.2)
CO2: 21 mmol/L (ref 20–29)
Calcium: 10.3 mg/dL — ABNORMAL HIGH (ref 8.7–10.2)
Chloride: 99 mmol/L (ref 96–106)
Creatinine, Ser: 0.96 mg/dL (ref 0.57–1.00)
Globulin, Total: 2.8 g/dL (ref 1.5–4.5)
Glucose: 86 mg/dL (ref 70–99)
Potassium: 4.8 mmol/L (ref 3.5–5.2)
Sodium: 138 mmol/L (ref 134–144)
Total Protein: 7.7 g/dL (ref 6.0–8.5)
eGFR: 83 mL/min/{1.73_m2} (ref >59)

## 2023-01-27 LAB — VITAMIN D 25 HYDROXY (VIT D DEFICIENCY, FRACTURES): Vit D, 25-Hydroxy: 60.1 ng/mL (ref 30.0–100.0)

## 2023-01-27 LAB — FERRITIN: Ferritin: 159 ng/mL — ABNORMAL HIGH (ref 15–150)

## 2023-01-27 LAB — TSH: TSH: 0.97 u[IU]/mL (ref 0.450–4.500)

## 2023-01-27 LAB — IRON AND TIBC
Iron Saturation: 30 % (ref 15–55)
Iron: 102 ug/dL (ref 27–159)
Total Iron Binding Capacity: 344 ug/dL (ref 250–450)
UIBC: 242 ug/dL (ref 131–425)

## 2023-01-27 LAB — B12 AND FOLATE PANEL
Folate: 10.6 ng/mL (ref >3.0)
Vitamin B-12: 358 pg/mL (ref 232–1245)

## 2023-01-27 LAB — SEDIMENTATION RATE: Sed Rate: 2 mm/h (ref 0–32)

## 2023-02-04 ENCOUNTER — Telehealth (HOSPITAL_COMMUNITY): Payer: Self-pay | Admitting: *Deleted

## 2023-02-04 NOTE — Telephone Encounter (Signed)
Per provider to please reschedule patient for another time that is not 4pm due to not seeing new patients at 4pm. Staff called patient to resch for another time/day but was not able to reach patient. Staff [facility] for patient to call office and resch appt. As of right now, provider stated we can put patient in for an 8am slot. For right now, Options are Dec 3,4,9,10,11. Staff will send patient a message via her mychart and cancel appt.

## 2023-02-09 ENCOUNTER — Other Ambulatory Visit: Payer: Self-pay | Admitting: Neurology

## 2023-02-09 MED ORDER — AMPHETAMINE-DEXTROAMPHETAMINE 10 MG PO TABS: 10.0000 mg | ORAL_TABLET | Freq: Two times a day (BID) | ORAL | 0 refills | Status: DC

## 2023-02-10 ENCOUNTER — Other Ambulatory Visit: Payer: Self-pay

## 2023-02-10 MED ORDER — AMPHETAMINE-DEXTROAMPHETAMINE 10 MG PO TABS
10.0000 mg | ORAL_TABLET | Freq: Two times a day (BID) | ORAL | 0 refills | Status: DC
  Filled 2023-02-10: qty 60, 30d supply, fill #0

## 2023-02-17 ENCOUNTER — Other Ambulatory Visit (HOSPITAL_COMMUNITY): Payer: Self-pay

## 2023-02-19 ENCOUNTER — Ambulatory Visit: Payer: 59 | Admitting: Family Medicine

## 2023-02-19 ENCOUNTER — Other Ambulatory Visit (HOSPITAL_COMMUNITY): Payer: Self-pay

## 2023-02-19 ENCOUNTER — Telehealth (HOSPITAL_COMMUNITY): Payer: Self-pay

## 2023-02-19 NOTE — Telephone Encounter (Signed)
02/23/23 appt confirmed by pt

## 2023-02-20 ENCOUNTER — Encounter (HOSPITAL_COMMUNITY): Payer: Self-pay | Admitting: Psychiatry

## 2023-02-20 ENCOUNTER — Other Ambulatory Visit: Payer: Self-pay

## 2023-02-20 ENCOUNTER — Other Ambulatory Visit (HOSPITAL_COMMUNITY): Payer: Self-pay

## 2023-02-20 ENCOUNTER — Telehealth (INDEPENDENT_AMBULATORY_CARE_PROVIDER_SITE_OTHER): Payer: 59 | Admitting: Psychiatry

## 2023-02-20 DIAGNOSIS — F4321 Adjustment disorder with depressed mood: Secondary | ICD-10-CM | POA: Diagnosis not present

## 2023-02-20 DIAGNOSIS — F410 Panic disorder [episodic paroxysmal anxiety] without agoraphobia: Secondary | ICD-10-CM

## 2023-02-20 DIAGNOSIS — F331 Major depressive disorder, recurrent, moderate: Secondary | ICD-10-CM

## 2023-02-20 DIAGNOSIS — F411 Generalized anxiety disorder: Secondary | ICD-10-CM | POA: Diagnosis not present

## 2023-02-20 DIAGNOSIS — F4310 Post-traumatic stress disorder, unspecified: Secondary | ICD-10-CM | POA: Diagnosis not present

## 2023-02-20 DIAGNOSIS — F4000 Agoraphobia, unspecified: Secondary | ICD-10-CM

## 2023-02-20 DIAGNOSIS — F4001 Agoraphobia with panic disorder: Secondary | ICD-10-CM

## 2023-02-20 MED ORDER — CITALOPRAM HYDROBROMIDE 20 MG PO TABS
ORAL_TABLET | ORAL | 0 refills | Status: DC
  Filled 2023-02-23 – 2023-03-19 (×3): qty 30, 37d supply, fill #0
  Filled 2023-03-19: qty 30, 30d supply, fill #0

## 2023-02-20 MED ORDER — ONDANSETRON HCL 4 MG PO TABS
4.0000 mg | ORAL_TABLET | Freq: Three times a day (TID) | ORAL | 0 refills | Status: DC | PRN
  Filled 2023-02-23: qty 20, 7d supply, fill #0

## 2023-02-20 MED ORDER — ALPRAZOLAM 0.25 MG PO TABS
0.2500 mg | ORAL_TABLET | Freq: Three times a day (TID) | ORAL | 0 refills | Status: DC | PRN
  Filled 2023-02-23: qty 60, 20d supply, fill #0

## 2023-02-20 MED ORDER — NURTEC 75 MG PO TBDP
ORAL_TABLET | ORAL | 10 refills | Status: DC
  Filled 2023-02-23 (×2): qty 16, 30d supply, fill #0
  Filled 2023-02-24: qty 8, 30d supply, fill #0
  Filled 2023-03-21: qty 8, 30d supply, fill #1
  Filled ????-??-??: fill #0

## 2023-02-20 MED ORDER — VALACYCLOVIR HCL 1 G PO TABS
1000.0000 mg | ORAL_TABLET | Freq: Every day | ORAL | 8 refills | Status: DC
  Filled 2023-02-23 (×2): qty 30, 30d supply, fill #0
  Filled 2023-03-21: qty 30, 30d supply, fill #1

## 2023-02-20 MED ORDER — QULIPTA 60 MG PO TABS
60.0000 mg | ORAL_TABLET | Freq: Every day | ORAL | 10 refills | Status: DC
  Filled 2023-02-23 – 2023-02-24 (×3): qty 30, 30d supply, fill #0
  Filled 2023-03-21 – 2023-04-08 (×3): qty 30, 30d supply, fill #1
  Filled ????-??-??: fill #0

## 2023-02-20 MED ORDER — CITALOPRAM HYDROBROMIDE 20 MG PO TABS
20.0000 mg | ORAL_TABLET | Freq: Every day | ORAL | 2 refills | Status: DC
  Filled 2023-02-20: qty 30, 30d supply, fill #0
  Filled 2023-04-08 (×2): qty 30, 30d supply, fill #1

## 2023-02-20 NOTE — Patient Instructions (Signed)
We did not make any medication changes today.  Keep up the good work with staying abstinent from alcohol and continue to utilize the DBT manual to boost the effectiveness of the psychotherapy you will start.
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
## 2023-02-23 ENCOUNTER — Other Ambulatory Visit: Payer: Self-pay | Admitting: Neurology

## 2023-02-23 ENCOUNTER — Other Ambulatory Visit: Payer: Self-pay

## 2023-02-23 ENCOUNTER — Other Ambulatory Visit (HOSPITAL_COMMUNITY): Payer: Self-pay

## 2023-02-23 ENCOUNTER — Ambulatory Visit (INDEPENDENT_AMBULATORY_CARE_PROVIDER_SITE_OTHER): Payer: 59 | Admitting: Psychiatry

## 2023-02-23 ENCOUNTER — Encounter (HOSPITAL_COMMUNITY): Payer: Self-pay | Admitting: Psychiatry

## 2023-02-23 DIAGNOSIS — F4310 Post-traumatic stress disorder, unspecified: Secondary | ICD-10-CM | POA: Diagnosis not present

## 2023-02-23 DIAGNOSIS — F4321 Adjustment disorder with depressed mood: Secondary | ICD-10-CM | POA: Diagnosis not present

## 2023-02-23 DIAGNOSIS — H93A1 Pulsatile tinnitus, right ear: Secondary | ICD-10-CM

## 2023-02-23 DIAGNOSIS — F331 Major depressive disorder, recurrent, moderate: Secondary | ICD-10-CM
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
## 2023-02-24 ENCOUNTER — Other Ambulatory Visit: Payer: Self-pay

## 2023-02-25 ENCOUNTER — Other Ambulatory Visit (INDEPENDENT_AMBULATORY_CARE_PROVIDER_SITE_OTHER): Payer: 59

## 2023-02-25 ENCOUNTER — Other Ambulatory Visit: Payer: Self-pay

## 2023-02-25 DIAGNOSIS — H93A1 Pulsatile tinnitus, right ear: Secondary | ICD-10-CM

## 2023-02-26 ENCOUNTER — Ambulatory Visit (HOSPITAL_COMMUNITY): Payer: Self-pay | Admitting: Psychiatry

## 2023-03-02 ENCOUNTER — Ambulatory Visit (HOSPITAL_COMMUNITY): Payer: Self-pay | Admitting: Psychiatry

## 2023-03-03 ENCOUNTER — Other Ambulatory Visit: Payer: Self-pay

## 2023-03-03 ENCOUNTER — Other Ambulatory Visit: Payer: Self-pay | Admitting: Neurology

## 2023-03-03 DIAGNOSIS — R002 Palpitations: Secondary | ICD-10-CM

## 2023-03-03 DIAGNOSIS — R079 Chest pain, unspecified: Secondary | ICD-10-CM

## 2023-03-03 MED ORDER — PROPRANOLOL HCL 10 MG PO TABS
10.0000 mg | ORAL_TABLET | Freq: Three times a day (TID) | ORAL | 1 refills | Status: DC
  Filled 2023-03-03: qty 180, 60d supply, fill #0
  Filled 2023-08-19: qty 180, 60d supply, fill #1

## 2023-03-04 ENCOUNTER — Other Ambulatory Visit: Payer: Self-pay

## 2023-03-04 ENCOUNTER — Ambulatory Visit: Payer: 59 | Attending: Neurology

## 2023-03-04 ENCOUNTER — Other Ambulatory Visit: Payer: Self-pay | Admitting: Neurology

## 2023-03-04 DIAGNOSIS — R079 Chest pain, unspecified: Secondary | ICD-10-CM

## 2023-03-04 DIAGNOSIS — R002 Palpitations: Secondary | ICD-10-CM

## 2023-03-04 NOTE — Progress Notes (Unsigned)
Enrolled for [employer] to mail a ZIO XT long term holter monitor to the patients address on file.   EP to read.

## 2023-03-09 NOTE — Progress Notes (Signed)
[facility] [address] [place] [address] Phone: [phone] Subjective:   INadine Counts, am serving as a scribe for Dr. Antoine Primas.  I'm seeing this patient by the request  of:  Gilmore Laroche, FNP  CC: Headache, back and neck pain follow-up  [id number]  Karen Moss is a 27 y.o. female coming in with complaint of back and neck pain. OMT 01/22/2023. Patient states same per usual. No new concerns.  Medications patient has been prescribed: None     Laboratory workup did show the patient did have some hypercalcemia noted.  Mild decrease in RDW with an elevation in neutrophils as well as elevation in ferritin.  Patient's previous imaging included MRI of the lumbar, brain, cervical spine and head without any significant findings.  Also reviewing patient's chart was seen by cardiology recently and awaiting for 14-day monitor.  Reviewed prior external information including notes and imaging from previsou exam, outside providers and external EMR if available.   As well as notes that were available from care everywhere and other healthcare systems.  Past medical history, social, surgical and family history all reviewed in electronic medical record.  No pertanent information unless stated regarding to the chief complaint.   Past Medical History:  Diagnosis Date   ASCUS with positive high risk HPV cervical 03/13/2021   03/13/21 will get colpo per [facility]  5 year CIN 3+ risk is 5.4 %   Chlamydia 12/30/2016   Medical history non-contributory    Papanicolaou smear of cervix with positive high risk human papilloma virus (HPV) test 03/06/2020   02/2020 repeat in 1 year with HVP testing, [per [facility] 5 year risk CIN3 + 4.8 %   Wax in ear 02/16/2018    Allergies  Allergen Reactions   Tangerine Flavoring Agent (Non-Screening) Itching and Rash     Review of Systems:  No  visual changes, nausea, vomiting, diarrhea, constipation, dizziness,  abdominal pain, skin rash, fevers, chills, night sweats, weight loss, swollen lymph nodes, body aches, joint swelling, chest pain, shortness of breath, mood changes. POSITIVE muscle aches, headaches  Objective  Blood pressure 120/82, pulse 66, height 5\' 2"  (1.575 m), weight 142 lb (64.4 kg), SpO2 98%.   General: No apparent distress alert and oriented x3 mood and affect normal, dressed appropriately.  HEENT: Pupils equal, extraocular movements intact  Respiratory: Patient's speak in full sentences and does not appear short of breath  Cardiovascular: No lower extremity edema, non tender, no erythema  Neck exam does have some loss of lordosis noted.  Some tenderness to palpation in the paraspinal musculature.  Patient does have some limited sidebending.  Tightness noted in the occipital region right greater than left.  Osteopathic findings  C2 flexed rotated and side bent right C7 flexed rotated and side bent left T3 extended rotated and side bent right inhaled rib T9 extended rotated and side bent left L1 flexed rotated and side bent right Sacrum right on right    Assessment and Plan:  Chronic neck pain Chronic discomfort that is multifactorial.  Does respond extremely well though to osteopathic manipulation.  Discussed icing regimen and home exercises.  Discussed icing regimen and home exercises.  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  We did discuss potential other medications including Effexor 37.5 mg or Cymbalta 20 mg.  Patient will talk to her other providers about this.  Follow-up again in 6 to 8 weeks otherwise    Nonallopathic problems  Decision today to  treat with OMT was based on Physical Exam  After verbal consent patient was treated with HVLA, ME, FPR techniques in cervical, rib, thoracic, lumbar, and sacral  areas  Patient tolerated the procedure well with improvement in symptoms  Patient given exercises, stretches and lifestyle modifications  See medications in  patient instructions if given  Patient will follow up in 4-8 weeks     The above documentation has been reviewed and is accurate and complete Judi Saa, DO         Note: This dictation was prepared with Dragon dictation along with smaller phrase technology. Any transcriptional errors that result from this process are unintentional.

## 2023-03-12 ENCOUNTER — Other Ambulatory Visit: Payer: Self-pay | Admitting: Family Medicine

## 2023-03-12 DIAGNOSIS — E559 Vitamin D deficiency, unspecified: Secondary | ICD-10-CM

## 2023-03-16 ENCOUNTER — Ambulatory Visit: Payer: 59 | Admitting: Family Medicine

## 2023-03-16 ENCOUNTER — Encounter: Payer: Self-pay | Admitting: Family Medicine

## 2023-03-16 VITALS — BP 120/82 | HR 66 | Ht 62.0 in | Wt 142.0 lb

## 2023-03-16 DIAGNOSIS — M9903 Segmental and somatic dysfunction of lumbar region: Secondary | ICD-10-CM | POA: Diagnosis not present

## 2023-03-16 DIAGNOSIS — M9904 Segmental and somatic dysfunction of sacral region: Secondary | ICD-10-CM

## 2023-03-16 DIAGNOSIS — G8929 Other chronic pain: Secondary | ICD-10-CM

## 2023-03-16 DIAGNOSIS — M9908 Segmental and somatic dysfunction of rib cage: Secondary | ICD-10-CM

## 2023-03-16 DIAGNOSIS — M9902 Segmental and somatic dysfunction of thoracic region: Secondary | ICD-10-CM | POA: Diagnosis not present

## 2023-03-16 DIAGNOSIS — M542 Cervicalgia: Secondary | ICD-10-CM | POA: Diagnosis not present

## 2023-03-16 DIAGNOSIS — M9901 Segmental and somatic dysfunction of cervical region: Secondary | ICD-10-CM

## 2023-03-16 NOTE — Assessment & Plan Note (Signed)
Chronic discomfort that is multifactorial.  Does respond extremely well though to osteopathic manipulation.  Discussed icing regimen and home exercises.  Discussed icing regimen and home exercises.  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  We did discuss potential other medications including Effexor 37.5 mg or Cymbalta 20 mg.  Patient will talk to her other providers about this.  Follow-up again in 6 to 8 weeks otherwise

## 2023-03-16 NOTE — Patient Instructions (Signed)
See you again in 6 weeks Ask other providers about Effexor or Cymbalta

## 2023-03-17 ENCOUNTER — Other Ambulatory Visit: Payer: Self-pay

## 2023-03-17 ENCOUNTER — Other Ambulatory Visit: Payer: Self-pay | Admitting: Neurology

## 2023-03-17 ENCOUNTER — Other Ambulatory Visit (HOSPITAL_COMMUNITY): Payer: Self-pay

## 2023-03-17 MED ORDER — AMPHETAMINE-DEXTROAMPHETAMINE 10 MG PO TABS
10.0000 mg | ORAL_TABLET | Freq: Two times a day (BID) | ORAL | 0 refills | Status: DC
  Filled 2023-03-17: qty 60, 30d supply, fill #0

## 2023-03-17 NOTE — Progress Notes (Unsigned)
Refill requested

## 2023-03-19 ENCOUNTER — Other Ambulatory Visit (HOSPITAL_COMMUNITY): Payer: Self-pay

## 2023-03-19 ENCOUNTER — Other Ambulatory Visit: Payer: Self-pay

## 2023-03-19 DIAGNOSIS — R002 Palpitations: Secondary | ICD-10-CM

## 2023-03-19 DIAGNOSIS — R079 Chest pain, unspecified: Secondary | ICD-10-CM | POA: Diagnosis not present

## 2023-03-20 ENCOUNTER — Other Ambulatory Visit: Payer: Self-pay

## 2023-03-20 ENCOUNTER — Ambulatory Visit (HOSPITAL_COMMUNITY): Payer: 59 | Admitting: Psychiatry

## 2023-03-23 ENCOUNTER — Other Ambulatory Visit: Payer: Self-pay

## 2023-03-25 ENCOUNTER — Other Ambulatory Visit: Payer: Self-pay

## 2023-03-30 ENCOUNTER — Telehealth: Payer: Self-pay | Admitting: Pharmacy Technician

## 2023-03-30 ENCOUNTER — Other Ambulatory Visit (HOSPITAL_COMMUNITY): Payer: Self-pay

## 2023-03-30 NOTE — Telephone Encounter (Signed)
 Pharmacy Patient Advocate Encounter   Received notification from CoverMyMeds that prior authorization for Nurtec 75MG  dispersible tablets is required/requested.   Insurance verification completed.   The patient is insured through [facility] .   Per test claim: PA required; PA submitted to above mentioned insurance via CoverMyMeds Key/confirmation #/EOC [id number] Status is pending

## 2023-04-01 ENCOUNTER — Other Ambulatory Visit: Payer: Self-pay

## 2023-04-02 ENCOUNTER — Other Ambulatory Visit: Payer: Self-pay

## 2023-04-03 ENCOUNTER — Ambulatory Visit (HOSPITAL_COMMUNITY): Payer: 59 | Admitting: Psychiatry

## 2023-04-06 ENCOUNTER — Other Ambulatory Visit: Payer: Self-pay

## 2023-04-08 ENCOUNTER — Other Ambulatory Visit: Payer: Self-pay

## 2023-04-09 DIAGNOSIS — R002 Palpitations: Secondary | ICD-10-CM | POA: Diagnosis not present

## 2023-04-09 DIAGNOSIS — R079 Chest pain, unspecified: Secondary | ICD-10-CM | POA: Diagnosis not present

## 2023-04-13 ENCOUNTER — Encounter: Payer: Self-pay | Admitting: Neurology

## 2023-04-15 ENCOUNTER — Other Ambulatory Visit: Payer: Self-pay | Admitting: Neurology

## 2023-04-15 ENCOUNTER — Other Ambulatory Visit: Payer: Self-pay

## 2023-04-15 DIAGNOSIS — J069 Acute upper respiratory infection, unspecified: Secondary | ICD-10-CM

## 2023-04-15 MED ORDER — AZITHROMYCIN 250 MG PO TABS
ORAL_TABLET | ORAL | 0 refills | Status: DC
  Filled 2023-04-15: qty 6, 5d supply, fill #0

## 2023-04-16 ENCOUNTER — Other Ambulatory Visit: Payer: Self-pay

## 2023-04-16 MED ORDER — AMPHETAMINE-DEXTROAMPHETAMINE 10 MG PO TABS
10.0000 mg | ORAL_TABLET | Freq: Two times a day (BID) | ORAL | 0 refills | Status: DC
  Filled 2023-04-16: qty 60, 30d supply, fill #0

## 2023-04-16 NOTE — Progress Notes (Unsigned)
Refill requested by patient

## 2023-04-16 NOTE — Telephone Encounter (Signed)
Pharmacy Patient Advocate Encounter  Received notification from [facility] that Prior Authorization for Nurtec 75MG  dispersible tablets  has been CANCELLED due to   PA #/Case ID/Reference #: [id number]

## 2023-04-17 ENCOUNTER — Other Ambulatory Visit: Payer: Self-pay

## 2023-04-17 ENCOUNTER — Telehealth (HOSPITAL_COMMUNITY): Payer: 59 | Admitting: Psychiatry

## 2023-04-20 ENCOUNTER — Ambulatory Visit (INDEPENDENT_AMBULATORY_CARE_PROVIDER_SITE_OTHER): Payer: 59 | Admitting: Adult Health

## 2023-04-20 ENCOUNTER — Other Ambulatory Visit (HOSPITAL_COMMUNITY)
Admission: RE | Admit: 2023-04-20 | Discharge: 2023-04-20 | Disposition: A | Discharge status: Home / Self Care | Payer: 59 | Source: Ambulatory Visit | Attending: Adult Health | Admitting: Adult Health

## 2023-04-20 ENCOUNTER — Encounter: Payer: Self-pay | Admitting: Adult Health

## 2023-04-20 VITALS — BP 124/71 | HR 74 | Ht 62.0 in | Wt 139.5 lb

## 2023-04-20 DIAGNOSIS — Z01419 Encounter for gynecological examination (general) (routine) without abnormal findings: Secondary | ICD-10-CM | POA: Insufficient documentation

## 2023-04-20 DIAGNOSIS — Z1331 Encounter for screening for depression: Secondary | ICD-10-CM

## 2023-04-20 DIAGNOSIS — Z975 Presence of (intrauterine) contraceptive device: Secondary | ICD-10-CM | POA: Diagnosis not present

## 2023-04-20 DIAGNOSIS — Z1151 Encounter for screening for human papillomavirus (HPV): Secondary | ICD-10-CM | POA: Insufficient documentation

## 2023-04-20 DIAGNOSIS — R87810 Cervical high risk human papillomavirus (HPV) DNA test positive: Secondary | ICD-10-CM | POA: Diagnosis not present

## 2023-04-20 NOTE — Progress Notes (Signed)
Patient ID: Karen Moss, female   DOB: 1996-02-26, 28 y.o.   MRN: [id number] History of Present Illness: Karen Moss is a 28 year old black female, single, G0P0 in for a well woman gyn exam and pap. Last pap was negative malignancy, +HR HPV other  PCP is Gilmore Laroche NP   Current Medications, Allergies, Past Medical History, Past Surgical History, Family History and Social History were reviewed in [employer] record.     Review of Systems: Patient denies any hearing loss, fatigue, blurred vision, shortness of breath, chest pain, abdominal pain, problems with bowel movements, urination, or intercourse. No joint pain or mood swings.  Periods irregular, has nexplanon Has hx migraines  Has had palpitation and wore monitor  Physical Exam:BP 124/71 (BP Location: Left Arm, Patient Position: Sitting, Cuff Size: Normal)   Pulse 74   Ht 5\' 2"  (1.575 m)   Wt 139 lb 8 oz (63.3 kg)   LMP 03/10/2023   BMI 25.51 kg/m   General:  Well developed, well nourished, no acute distress Skin:  Warm and dry Neck:  Midline trachea, normal thyroid, good ROM, no lymphadenopathy Lungs; Clear to auscultation bilaterally Breast:  No dominant palpable mass, retraction, or nipple discharge,has bilateral nipple rods  Cardiovascular: Regular rate and rhythm Abdomen:  Soft, non tender, no hepatosplenomegaly Pelvic:  External genitalia is normal in appearance, no lesions.  The vagina is normal in appearance. Urethra has no lesions or masses. The cervix is smooth, pap with HR HPV genotyping and GC/CHL performed.  Uterus is felt to be normal size, shape, and contour.  No adnexal masses or tenderness noted.Bladder is non tender, no masses felt. Extremities/musculoskeletal:  No swelling or varicosities noted, no clubbing or cyanosis Psych:  No mood changes, alert and cooperative,seems happy AA is 1 Fall risk is low    04/20/2023    3:37 PM 02/23/2023    8:19 AM 01/23/2023   12:17 PM  Depression  screen PHQ 2/9  Decreased Interest 2    Down, Depressed, Hopeless 1    PHQ - 2 Score 3    Altered sleeping 1    Tired, decreased energy 2    Change in appetite 2    Feeling bad or failure about yourself  0    Trouble concentrating 3    Moving slowly or fidgety/restless 0    Suicidal thoughts 0    PHQ-9 Score 11    Difficult doing work/chores        Information is confidential and restricted. Go to Review Flowsheets to unlock data.   On meds     04/20/2023    3:37 PM 02/23/2023    8:22 AM 10/15/2022    2:27 PM 10/15/2022    2:18 PM  GAD 7 : Generalized Anxiety Score  Nervous, Anxious, on Edge 2  1 0  Control/stop worrying 2  2 0  Worry too much - different things 1  0 0  Trouble relaxing 1  1 0  Restless 0  2 0  Easily annoyed or irritable 1  0 0  Afraid - awful might happen 1  0 0  Total GAD 7 Score 8  6 0  Anxiety Difficulty   Somewhat difficult Not difficult at all     Information is confidential and restricted. Go to Review Flowsheets to unlock data.      Upstream - 04/20/23 1534       Pregnancy Intention Screening   Does the patient want to become  pregnant in the next year? No    Does the patient's partner want to become pregnant in the next year? No    Would the patient like to discuss contraceptive options today? No      Contraception Wrap Up   Current Method Hormonal Implant    End Method Hormonal Implant    Contraception Counseling Provided Yes            Examination chaperoned by Malachy Mood LPN   Impression and Plan: 1. Encounter for gynecological examination with Papanicolaou smear of cervix (Primary) Pap sent  Physical in 1 year Next pap TBD when results back  Labs with PCP  - Cytology - PAP( [facility])  2. Nexplanon in place Placed 01/16/23  3. Papanicolaou smear of cervix with positive high risk human papilloma virus (HPV) test Pap sent

## 2023-04-23 LAB — CYTOLOGY - PAP
Chlamydia: NEGATIVE
Comment: NEGATIVE
Comment: NEGATIVE
Comment: NEGATIVE
Comment: NEGATIVE
Comment: NORMAL
Diagnosis: UNDETERMINED — AB
HPV 16: NEGATIVE
HPV 18 / 45: NEGATIVE
High risk HPV: POSITIVE — AB
Neisseria Gonorrhea: NEGATIVE

## 2023-04-24 ENCOUNTER — Ambulatory Visit (HOSPITAL_COMMUNITY): Payer: 59 | Admitting: Psychiatry

## 2023-04-27 ENCOUNTER — Ambulatory Visit: Payer: 59 | Admitting: Family Medicine

## 2023-05-01 ENCOUNTER — Encounter (HOSPITAL_COMMUNITY): Payer: Self-pay | Admitting: Psychiatry

## 2023-05-01 ENCOUNTER — Telehealth (HOSPITAL_COMMUNITY): Payer: 59 | Admitting: Psychiatry

## 2023-05-01 ENCOUNTER — Other Ambulatory Visit: Payer: Self-pay

## 2023-05-01 DIAGNOSIS — F4001 Agoraphobia with panic disorder: Secondary | ICD-10-CM | POA: Diagnosis not present

## 2023-05-01 DIAGNOSIS — F4321 Adjustment disorder with depressed mood: Secondary | ICD-10-CM

## 2023-05-01 DIAGNOSIS — F411 Generalized anxiety disorder: Secondary | ICD-10-CM | POA: Diagnosis not present

## 2023-05-01 DIAGNOSIS — F4310 Post-traumatic stress disorder, unspecified: Secondary | ICD-10-CM | POA: Diagnosis not present

## 2023-05-01 DIAGNOSIS — F4000 Agoraphobia, unspecified: Secondary | ICD-10-CM

## 2023-05-01 DIAGNOSIS — F331 Major depressive disorder, recurrent, moderate: Secondary | ICD-10-CM | POA: Diagnosis not present

## 2023-05-01 DIAGNOSIS — F410 Panic disorder [episodic paroxysmal anxiety] without agoraphobia: Secondary | ICD-10-CM

## 2023-05-01 MED ORDER — CITALOPRAM HYDROBROMIDE 20 MG PO TABS
20.0000 mg | ORAL_TABLET | Freq: Every day | ORAL | 0 refills | Status: DC
  Filled 2023-05-01 – 2023-05-20 (×2): qty 90, 90d supply, fill #0
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
## 2023-05-01 NOTE — Patient Instructions (Signed)
We did not make any medication changes today.  Keep up the good work with self-care and getting through your studies.

## 2023-05-08 ENCOUNTER — Ambulatory Visit (HOSPITAL_COMMUNITY): Payer: 59 | Admitting: Psychiatry

## 2023-05-12 ENCOUNTER — Encounter: Payer: Self-pay | Admitting: Obstetrics & Gynecology

## 2023-05-12 ENCOUNTER — Ambulatory Visit: Payer: 59 | Admitting: Obstetrics & Gynecology

## 2023-05-12 VITALS — BP 125/72 | HR 78

## 2023-05-12 DIAGNOSIS — R87810 Cervical high risk human papillomavirus (HPV) DNA test positive: Secondary | ICD-10-CM | POA: Diagnosis not present

## 2023-05-12 DIAGNOSIS — R87610 Atypical squamous cells of undetermined significance on cytologic smear of cervix (ASC-US): Secondary | ICD-10-CM

## 2023-05-12 DIAGNOSIS — Z3202 Encounter for pregnancy test, result negative: Secondary | ICD-10-CM | POA: Diagnosis not present

## 2023-05-12 LAB — POCT URINE PREGNANCY: Preg Test, Ur: NEGATIVE

## 2023-05-12 NOTE — Progress Notes (Signed)
    Colposcopy Procedure Note:    Colposcopy Procedure Note  Indications:   2025 ASCUS + HR HPV negative 16/18/45 2024 Normal cytology + HR HPV (negative 16/18/45)  2019 ASCCP recommendation:  Smoker:  No. New sexual partner:  No.  :  History of abnormal Pap: yes  Procedure Details  The risks and benefits of the procedure and Written informed consent obtained.  Speculum placed in vagina and excellent visualization of cervix achieved, cervix swabbed x 3 with acetic acid solution.  Findings: Adequate colposcopy is noted today.  Cervix: very light AWE 10-4 o'clock no punctation no mosaicism, no areas of dense AWE; SCJ visualized 360 degrees without lesions and no biopsies taken. Vaginal inspection: normal without visible lesions. Vulvar colposcopy: vulvar colposcopy not performed.  Specimens: none  Complications: none.  Colposcopic Impression: MIld koilocytic atypia seen  Plan(Based on 2019 ASCCP recommendations) Follow up HPV based cytology in 1 year

## 2023-05-20 ENCOUNTER — Other Ambulatory Visit: Payer: Self-pay

## 2023-05-20 MED ORDER — AMPHETAMINE-DEXTROAMPHETAMINE 10 MG PO TABS
10.0000 mg | ORAL_TABLET | Freq: Two times a day (BID) | ORAL | 0 refills | Status: DC
  Filled 2023-05-20: qty 60, 30d supply, fill #0

## 2023-05-20 NOTE — Progress Notes (Signed)
 Patient is requesting refill of amphetamine-dextroamphetamine 10 mg tablet.

## 2023-05-22 ENCOUNTER — Other Ambulatory Visit: Payer: Self-pay

## 2023-05-29 ENCOUNTER — Ambulatory Visit (HOSPITAL_COMMUNITY): Payer: 59 | Admitting: Psychiatry

## 2023-06-23 ENCOUNTER — Telehealth: Payer: Self-pay | Admitting: Neurology

## 2023-06-23 NOTE — Telephone Encounter (Signed)
 Patient would like a refill of her adderall please. Last filled 05/22/23

## 2023-06-24 ENCOUNTER — Telehealth: Payer: Self-pay | Admitting: Pharmacist

## 2023-06-24 ENCOUNTER — Other Ambulatory Visit (HOSPITAL_COMMUNITY): Payer: Self-pay

## 2023-06-24 NOTE — Telephone Encounter (Signed)
 PA not needed at this time.

## 2023-06-24 NOTE — Telephone Encounter (Signed)
 Pharmacy Patient Advocate Encounter   Received notification from CoverMyMeds that prior authorization for Qulipta 60MG  tablets is required/requested.   Insurance verification completed.   The patient is insured through [facility] .   Per test claim: PA required; PA submitted to above mentioned insurance via CoverMyMeds Key/confirmation #/EOC [id number] Status is pending

## 2023-06-24 NOTE — Telephone Encounter (Signed)
 Pharmacy Patient Advocate Encounter  Received notification from [facility] that Prior Authorization for Qulipta 60MG  tablets has been APPROVED from 06/24/2023 to 06/23/2024   PA #/Case ID/Reference #: [id number]

## 2023-06-25 ENCOUNTER — Other Ambulatory Visit: Payer: Self-pay | Admitting: Neurology

## 2023-06-25 ENCOUNTER — Other Ambulatory Visit: Payer: Self-pay

## 2023-06-25 DIAGNOSIS — G43009 Migraine without aura, not intractable, without status migrainosus: Secondary | ICD-10-CM

## 2023-06-25 MED ORDER — AMPHETAMINE-DEXTROAMPHETAMINE 10 MG PO TABS
10.0000 mg | ORAL_TABLET | Freq: Two times a day (BID) | ORAL | 0 refills | Status: DC
  Filled 2023-06-25: qty 60, 30d supply, fill #0

## 2023-06-25 MED ORDER — QULIPTA 60 MG PO TABS
60.0000 mg | ORAL_TABLET | Freq: Every day | ORAL | 11 refills | Status: AC
  Filled 2023-06-25: qty 30, 30d supply, fill #0
  Filled 2023-07-20: qty 30, 30d supply, fill #1
  Filled 2023-08-19: qty 30, 30d supply, fill #2
  Filled 2023-09-23: qty 30, 30d supply, fill #3
  Filled 2023-10-21: qty 30, 30d supply, fill #4
  Filled 2023-11-25: qty 30, 30d supply, fill #5
  Filled 2023-12-24: qty 30, 30d supply, fill #6
  Filled 2024-01-25: qty 30, 30d supply, fill #7
  Filled 2024-04-19: qty 30, 30d supply, fill #8

## 2023-06-25 NOTE — Telephone Encounter (Signed)
 Done, thanks

## 2023-07-13 ENCOUNTER — Encounter (INDEPENDENT_AMBULATORY_CARE_PROVIDER_SITE_OTHER): Payer: Self-pay

## 2023-07-13 DIAGNOSIS — Z719 Counseling, unspecified: Secondary | ICD-10-CM

## 2023-07-17 ENCOUNTER — Encounter (HOSPITAL_COMMUNITY): Payer: Self-pay | Admitting: Psychiatry

## 2023-07-17 ENCOUNTER — Telehealth (HOSPITAL_COMMUNITY): Payer: 59 | Admitting: Psychiatry

## 2023-07-17 ENCOUNTER — Other Ambulatory Visit: Payer: Self-pay

## 2023-07-17 ENCOUNTER — Encounter (HOSPITAL_COMMUNITY): Payer: Self-pay

## 2023-07-17 DIAGNOSIS — F4321 Adjustment disorder with depressed mood: Secondary | ICD-10-CM | POA: Diagnosis not present

## 2023-07-17 DIAGNOSIS — F411 Generalized anxiety disorder: Secondary | ICD-10-CM | POA: Diagnosis not present

## 2023-07-17 DIAGNOSIS — F4310 Post-traumatic stress disorder, unspecified: Secondary | ICD-10-CM

## 2023-07-17 DIAGNOSIS — F4001 Agoraphobia with panic disorder: Secondary | ICD-10-CM | POA: Diagnosis not present

## 2023-07-17 DIAGNOSIS — F4000 Agoraphobia, unspecified: Secondary | ICD-10-CM

## 2023-07-17 DIAGNOSIS — F331 Major depressive disorder, recurrent, moderate: Secondary | ICD-10-CM | POA: Diagnosis not present

## 2023-07-17 DIAGNOSIS — F410 Panic disorder [episodic paroxysmal anxiety] without agoraphobia: Secondary | ICD-10-CM

## 2023-07-17 MED ORDER — CITALOPRAM HYDROBROMIDE 20 MG PO TABS
30.0000 mg | ORAL_TABLET | Freq: Every day | ORAL | 1 refills | Status: AC
Start: 2023-07-17 — End: ?
  Filled 2023-07-17 (×2): qty 135, 90d supply, fill #0
  Filled 2023-10-21: qty 135, 90d supply, fill #1
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
## 2023-07-17 NOTE — Patient Instructions (Signed)
 We increased the Celexa  to 30 mg (1.5 tablets) once daily today.  Try setting a meal alarm for dinner and having consistent meals or snacks during the day.  It can also be helpful to see a nutritionist if you need further recommendations.

## 2023-07-23 ENCOUNTER — Other Ambulatory Visit: Payer: Self-pay | Admitting: Neurology

## 2023-07-23 ENCOUNTER — Other Ambulatory Visit: Payer: Self-pay

## 2023-07-23 MED ORDER — AMPHETAMINE-DEXTROAMPHETAMINE 10 MG PO TABS
10.0000 mg | ORAL_TABLET | Freq: Two times a day (BID) | ORAL | 0 refills | Status: DC
  Filled 2023-07-23: qty 60, 30d supply, fill #0

## 2023-08-19 ENCOUNTER — Other Ambulatory Visit: Payer: Self-pay

## 2023-08-24 ENCOUNTER — Other Ambulatory Visit: Payer: Self-pay | Admitting: Neurology

## 2023-08-24 ENCOUNTER — Other Ambulatory Visit: Payer: Self-pay

## 2023-08-24 ENCOUNTER — Encounter

## 2023-08-24 MED ORDER — AMPHETAMINE-DEXTROAMPHETAMINE 10 MG PO TABS
10.0000 mg | ORAL_TABLET | Freq: Two times a day (BID) | ORAL | 0 refills | Status: DC
  Filled 2023-08-24: qty 60, 30d supply, fill #0

## 2023-08-25 ENCOUNTER — Other Ambulatory Visit: Payer: Self-pay

## 2023-09-23 ENCOUNTER — Other Ambulatory Visit: Payer: Self-pay

## 2023-09-23 ENCOUNTER — Other Ambulatory Visit: Payer: Self-pay | Admitting: Neurology

## 2023-09-23 MED ORDER — AMPHETAMINE-DEXTROAMPHETAMINE 10 MG PO TABS
10.0000 mg | ORAL_TABLET | Freq: Two times a day (BID) | ORAL | 0 refills | Status: DC
Start: 2023-09-23 — End: 2023-10-21
  Filled 2023-09-23: qty 60, 30d supply, fill #0

## 2023-09-23 MED ORDER — NURTEC 75 MG PO TBDP
ORAL_TABLET | ORAL | 10 refills | Status: AC
  Filled 2023-09-23: qty 8, 30d supply, fill #0
  Filled 2023-10-24: qty 8, 30d supply, fill #1
  Filled 2023-11-25: qty 8, 30d supply, fill #2
  Filled 2023-12-24: qty 8, 30d supply, fill #3
  Filled 2024-01-25: qty 8, 30d supply, fill #4
  Filled 2024-04-19: qty 8, 30d supply, fill #5

## 2023-09-24 ENCOUNTER — Other Ambulatory Visit: Payer: Self-pay

## 2023-09-25 ENCOUNTER — Other Ambulatory Visit: Payer: Self-pay

## 2023-09-28 ENCOUNTER — Telehealth: Payer: Self-pay | Admitting: Neurology

## 2023-09-28 ENCOUNTER — Other Ambulatory Visit: Payer: Self-pay

## 2023-09-28 NOTE — Telephone Encounter (Signed)
 PA submitted through CMM/medimpact Immediate approval for the patient.   KEY- [id number] Approved for year 09/28/23-09/27/2024.

## 2023-10-21 ENCOUNTER — Other Ambulatory Visit: Payer: Self-pay

## 2023-10-21 ENCOUNTER — Other Ambulatory Visit: Payer: Self-pay | Admitting: Neurology

## 2023-10-21 MED ORDER — ONDANSETRON 8 MG PO TBDP
8.0000 mg | ORAL_TABLET | Freq: Three times a day (TID) | ORAL | 0 refills | Status: AC | PRN
  Filled 2023-10-21: qty 24, 8d supply, fill #0

## 2023-10-21 MED ORDER — AMPHETAMINE-DEXTROAMPHETAMINE 10 MG PO TABS
10.0000 mg | ORAL_TABLET | Freq: Two times a day (BID) | ORAL | 0 refills | Status: DC
  Filled 2023-10-21 – 2023-10-26 (×2): qty 60, 30d supply, fill #0

## 2023-10-21 MED ORDER — PROPRANOLOL HCL 10 MG PO TABS
10.0000 mg | ORAL_TABLET | Freq: Three times a day (TID) | ORAL | 1 refills | Status: AC
  Filled 2023-10-21: qty 180, 60d supply, fill #0

## 2023-10-22 ENCOUNTER — Encounter: Payer: Self-pay | Admitting: Family Medicine

## 2023-10-22 ENCOUNTER — Ambulatory Visit (INDEPENDENT_AMBULATORY_CARE_PROVIDER_SITE_OTHER): Admitting: Family Medicine

## 2023-10-22 ENCOUNTER — Other Ambulatory Visit: Payer: Self-pay

## 2023-10-22 VITALS — BP 116/78 | HR 63 | Ht 62.0 in | Wt 135.1 lb

## 2023-10-22 DIAGNOSIS — F4321 Adjustment disorder with depressed mood: Secondary | ICD-10-CM | POA: Diagnosis not present

## 2023-10-22 DIAGNOSIS — F4310 Post-traumatic stress disorder, unspecified: Secondary | ICD-10-CM

## 2023-10-22 DIAGNOSIS — Z0001 Encounter for general adult medical examination with abnormal findings: Secondary | ICD-10-CM | POA: Diagnosis not present

## 2023-10-22 DIAGNOSIS — E559 Vitamin D deficiency, unspecified: Secondary | ICD-10-CM | POA: Diagnosis not present

## 2023-10-22 DIAGNOSIS — F4000 Agoraphobia, unspecified: Secondary | ICD-10-CM

## 2023-10-22 DIAGNOSIS — F410 Panic disorder [episodic paroxysmal anxiety] without agoraphobia: Secondary | ICD-10-CM

## 2023-10-22 DIAGNOSIS — E7849 Other hyperlipidemia: Secondary | ICD-10-CM

## 2023-10-22 DIAGNOSIS — F331 Major depressive disorder, recurrent, moderate: Secondary | ICD-10-CM | POA: Diagnosis not present

## 2023-10-22 DIAGNOSIS — F411 Generalized anxiety disorder: Secondary | ICD-10-CM | POA: Diagnosis not present

## 2023-10-22 DIAGNOSIS — E038 Other specified hypothyroidism: Secondary | ICD-10-CM | POA: Diagnosis not present

## 2023-10-22 DIAGNOSIS — R7301 Impaired fasting glucose: Secondary | ICD-10-CM

## 2023-10-22 MED ORDER — CITALOPRAM HYDROBROMIDE 20 MG PO TABS
30.0000 mg | ORAL_TABLET | Freq: Every day | ORAL | 1 refills | Status: AC
  Filled 2024-01-19: qty 135, 90d supply, fill #0

## 2023-10-22 NOTE — Progress Notes (Signed)
 Complete physical exam  Patient: Karen Moss   DOB: [birth year]   27 y.o. Female  MRN: [id number]  Subjective:    Chief Complaint  Patient presents with   Medical Management of Chronic Issues    Physical, routine follow up, labs, also wants to know if you will manage her Celexa  since Dr.Stinson is gone     Karen Moss is a 28 y.o. female who presents today for a complete physical exam. She reports consuming a general diet. Exercise 3 days a week moderate intensity.  She generally feels well. She reports sleeping well. She does have additional problems to discuss today.   Most recent fall risk assessment:    04/20/2023    3:34 PM  Fall Risk   Falls in the past year? 0  Number falls in past yr: 0  Injury with Fall? 0     Most recent depression screenings:    10/22/2023   10:26 AM 04/20/2023    3:37 PM  PHQ 2/9 Scores  PHQ - 2 Score 2 3  PHQ- 9 Score 9 11    Dental: No current dental problems and Last dental visit: 10/23/2023  Patient Active Problem List   Diagnosis Date Noted   Nexplanon  in place 04/20/2023   PTSD (post-traumatic stress disorder) 01/23/2023   Generalized anxiety disorder with panic attacks 01/23/2023   Agoraphobia 01/23/2023   Somatic dysfunction of spine, cervical 01/22/2023   Migraine without aura and without status migrainosus, not intractable 01/04/2023   Cervicalgia 01/04/2023   Cervical myofascial pain syndrome 01/04/2023   Chronic neck pain 01/04/2023   Attention deficit 10/15/2022   Right wrist tendinitis 03/28/2022   Encounter for annual general medical examination with abnormal findings in adult 03/21/2022   ASCUS with positive high risk HPV cervical 03/13/2021   Moderate alcohol use disorder, in early remission (HCC) 03/01/2021   Papanicolaou smear of cervix with positive high risk human papilloma virus (HPV) test 03/06/2020   Encounter for gynecological examination with Papanicolaou smear of cervix 02/27/2020   Major depressive  disorder, recurrent episode, moderate (HCC) 02/27/2020   Irregular intermenstrual bleeding 02/27/2020   Encounter for removal and reinsertion of Nexplanon  01/11/2020   Pregnancy examination or test, negative result 01/11/2020   Encounter for well woman exam with routine gynecological exam 02/16/2018   Family planning 02/16/2018   Screening examination for STD (sexually transmitted disease) 02/16/2018   Chlamydia 12/30/2016   Grief 08/21/2013   Past Medical History:  Diagnosis Date   ASCUS with positive high risk HPV cervical 03/13/2021   03/13/21 will get colpo per [facility]  5 year CIN 3+ risk is 5.4 %   Chlamydia 12/30/2016   Medical history non-contributory    Migraine    Papanicolaou smear of cervix with positive high risk human papilloma virus (HPV) test 03/06/2020   02/2020 repeat in 1 year with HVP testing, [per [facility] 5 year risk CIN3 + 4.8 %   Wax in ear 02/16/2018   Past Surgical History:  Procedure Laterality Date   REFRACTIVE SURGERY Bilateral    Lasik eye surgery   Social History   Tobacco Use   Smoking status: Never   Smokeless tobacco: Never  Vaping Use   Vaping status: Never Used  Substance Use Topics   Alcohol use: Not Currently    Comment: Last drink in March.  See psychiatry note from 01/23/2023   Drug use: No   Social History   Socioeconomic History   Marital status: Single  Spouse name: Not on file   Number of children: Not on file   Years of education: Not on file   Highest education level: 12th grade  Occupational History   Not on file  Tobacco Use   Smoking status: Never   Smokeless tobacco: Never  Vaping Use   Vaping status: Never Used  Substance and Sexual Activity   Alcohol use: Not Currently    Comment: Last drink in March.  See psychiatry note from 01/23/2023   Drug use: No   Sexual activity: Yes    Birth control/protection: Implant  Other Topics Concern   Not on file  Social History Narrative   Not on file   Social Drivers of  Health   Financial Resource Strain: Low Risk  (10/21/2023)   Overall Financial Resource Strain (CARDIA)    Difficulty of Paying Living Expenses: Not hard at all  Food Insecurity: No Food Insecurity (10/21/2023)   Hunger Vital Sign    Worried About Running Out of Food in the Last Year: Never true    Ran Out of Food in the Last Year: Never true  Transportation Needs: No Transportation Needs (10/21/2023)   PRAPARE - Administrator, Civil Service (Medical): No    Lack of Transportation (Non-Medical): No  Physical Activity: Insufficiently Active (10/21/2023)   Exercise Vital Sign    Days of Exercise per Week: 3 days    Minutes of Exercise per Session: 30 min  Stress: Stress Concern Present (10/21/2023)   [employer] of Occupational Health - Occupational Stress Questionnaire    Feeling of Stress: To some extent  Social Connections: Moderately Isolated (10/21/2023)   Social Connection and Isolation Panel    Frequency of Communication with Friends and Family: More than three times a week    Frequency of Social Gatherings with Friends and Family: Once a week    Attends Religious Services: More than 4 times per year    Active Member of [employer] or Organizations: No    Attends Engineer, structural: Not on file    Marital Status: Never married  Intimate Partner Violence: Not At Risk (04/20/2023)   Humiliation, Afraid, Rape, and Kick questionnaire    Fear of Current or Ex-Partner: No    Emotionally Abused: No    Physically Abused: No    Sexually Abused: No   Family Status  Relation Name Status   Mother  Alive   Father  Alive   MGF  Deceased   MGM  Deceased   PGF  Deceased   PGM  Deceased  No partnership data on file   Family History  Problem Relation Age of Onset   Anxiety disorder Mother    Depression Mother    Hypertension Mother    Stroke Mother    Post-traumatic stress disorder Mother    Heart disease Father    Cancer Father 109       prostate   Cancer Maternal  Grandfather        brain   Cancer Maternal Grandmother        lung   Allergies  Allergen Reactions   Tangerine Flavoring Agent (Non-Screening) Itching and Rash      Patient Care Team: Ajee Heasley, FNP as PCP - General (Family Medicine)   Outpatient Medications Prior to Visit  Medication Sig   amphetamine -dextroamphetamine  (ADDERALL) 10 MG tablet Take 1 tablet (10 mg total) by mouth 2 (two) times daily with a meal.   Atogepant  (QULIPTA ) 60 MG  TABS Take 1 tablet (60 mg total) by mouth daily.   etonogestrel  (NEXPLANON ) 68 MG IMPL implant Inject 1 each into the skin once.   megestrol  (MEGACE ) 40 MG tablet TAKE 1 TABLET BY MOUTH ONCE DAILY for spotting   ondansetron  (ZOFRAN -ODT) 8 MG disintegrating tablet Take 1 tablet (8 mg total) by mouth every 8 (eight) hours as needed for nausea or vomiting.   propranolol  (INDERAL ) 10 MG tablet Take 1 tablet (10 mg total) by mouth 3 (three) times daily.   Rimegepant Sulfate  (NURTEC) 75 MG TBDP Take 1 tablet (75 mg total) by mouth daily as needed. For migraines. Take as close to onset of migraine as possible. One daily maximum.   Rimegepant Sulfate  (NURTEC) 75 MG TBDP Disolve 1 tablet (75 mg total) on the tongue once daily if needed. Take as close to onset of migraine as possible.   valACYclovir  (VALTREX ) 1000 MG tablet Take 1 tablet by mouth once daily   [DISCONTINUED] citalopram  (CELEXA ) 20 MG tablet Take 1.5 tablets (30 mg total) by mouth daily.   No facility-administered medications prior to visit.    Review of Systems  Constitutional:  Negative for chills, fever and malaise/fatigue.  HENT:  Negative for congestion and sinus pain.   Eyes:  Negative for pain, discharge and redness.  Respiratory:  Negative for cough, sputum production and shortness of breath.   Cardiovascular:  Negative for chest pain, palpitations, claudication and leg swelling.  Gastrointestinal:  Negative for diarrhea, heartburn and nausea.  Genitourinary:  Negative for  flank pain and frequency.  Musculoskeletal:  Negative for back pain and joint pain.  Skin:  Negative for itching.  Neurological:  Negative for dizziness, seizures and headaches.  Endo/Heme/Allergies:  Negative for environmental allergies.  Psychiatric/Behavioral:  Negative for memory loss. The patient does not have insomnia.        Objective:    BP 116/78   Pulse 63   Ht 5' 2 (1.575 m)   Wt 135 lb 1.3 oz (61.3 kg)   SpO2 98%   BMI 24.71 kg/m  BP Readings from Last 3 Encounters:  10/22/23 116/78  05/12/23 125/72  04/20/23 124/71   Wt Readings from Last 3 Encounters:  10/22/23 135 lb 1.3 oz (61.3 kg)  04/20/23 139 lb 8 oz (63.3 kg)  03/16/23 142 lb (64.4 kg)      Physical Exam HENT:     Head: Normocephalic.     Left Ear: External ear normal.     Mouth/Throat:     Mouth: Mucous membranes are moist.  Eyes:     Extraocular Movements: Extraocular movements intact.     Pupils: Pupils are equal, round, and reactive to light.  Cardiovascular:     Rate and Rhythm: Normal rate and regular rhythm.     Heart sounds: No murmur heard. Pulmonary:     Effort: Pulmonary effort is normal.     Breath sounds: Normal breath sounds.  Abdominal:     Palpations: Abdomen is soft.     Tenderness: There is no right CVA tenderness or left CVA tenderness.  Musculoskeletal:     Right lower leg: No edema.     Left lower leg: No edema.  Skin:    Findings: No lesion or rash.  Neurological:     Mental Status: She is alert and oriented to person, place, and time.     GCS: GCS eye subscore is 4. GCS verbal subscore is 5. GCS motor subscore is 6.     Cranial  Nerves: No facial asymmetry.     Sensory: No sensory deficit.     Motor: No weakness.     Coordination: Coordination normal. Finger-Nose-Finger Test normal.     Gait: Gait normal.  Psychiatric:        Judgment: Judgment normal.     No results found for any visits on 10/22/23. Last CBC Lab Results  Component Value Date   WBC 14.2  (H) 01/26/2023   HGB 15.2 01/26/2023   HCT 46.1 01/26/2023   MCV 96 01/26/2023   MCH 31.7 01/26/2023   RDW 11.6 (L) 01/26/2023   PLT 324 01/26/2023   Last metabolic panel Lab Results  Component Value Date   GLUCOSE 86 01/26/2023   NA 138 01/26/2023   K 4.8 01/26/2023   CL 99 01/26/2023   CO2 21 01/26/2023   BUN 11 01/26/2023   CREATININE 0.96 01/26/2023   EGFR 83 01/26/2023   CALCIUM 10.3 (H) 01/26/2023   PROT 7.7 01/26/2023   ALBUMIN 4.9 01/26/2023   LABGLOB 2.8 01/26/2023   AGRATIO 1.6 07/03/2022   BILITOT 1.0 01/26/2023   ALKPHOS 89 01/26/2023   AST 18 01/26/2023   ALT 15 01/26/2023   Last lipids Lab Results  Component Value Date   CHOL 125 07/03/2022   HDL 35 (L) 07/03/2022   LDLCALC 74 07/03/2022   TRIG 79 07/03/2022   CHOLHDL 3.6 07/03/2022   Last hemoglobin A1c Lab Results  Component Value Date   HGBA1C 5.0 07/03/2022   Last thyroid functions Lab Results  Component Value Date   TSH 0.970 01/26/2023   Last vitamin D  Lab Results  Component Value Date   VD25OH 60.1 01/26/2023   Last vitamin B12 and Folate Lab Results  Component Value Date   VITAMINB12 358 01/26/2023   FOLATE 10.6 01/26/2023        Assessment & Plan:    Routine Health Maintenance and Physical Exam  Immunization History  Administered Date(s) Administered   Influenza-Unspecified 01/07/2022   PFIZER(Purple Top)SARS-COV-2 Vaccination 12/01/2019, 12/22/2019   Tdap 04/22/2019    Health Maintenance  Topic Date Due   Pneumococcal Vaccine: 19-49 Years (1 of 2 - PCV) Never done   Hepatitis B Vaccines (1 of 3 - 19+ 3-dose series) Never done   COVID-19 Vaccine (3 - Pfizer risk series) 01/19/2020   HPV VACCINES (1 - 3-dose SCDM series) Never done   INFLUENZA VACCINE  10/16/2023   Cervical Cancer Screening (Pap smear)  04/19/2026   DTaP/Tdap/Td (2 - Td or Tdap) 04/21/2029   Hepatitis C Screening  Completed   HIV Screening  Completed   Meningococcal B Vaccine  Aged Out     Discussed health benefits of physical activity, and encouraged her to engage in regular exercise appropriate for her age and condition.  Encounter for annual general medical examination with abnormal findings in adult Assessment & Plan: Physical exam as documented Discussed heart-healthy diet  Encouraged to Exercise: If you are able: 30 -60 minutes a day ,4 days a week, or 150 minutes a week. The longer the better. Combine stretch, strength, and aerobic activities Encourage to eat whole Food, Plant Predominant Nutrition is highly recommended: Eat Plenty of vegetables, Mushrooms, fruits, Legumes, Whole Grains, Nuts, seeds in lieu of processed meats, processed snacks/pastries red meat, poultry, eggs.  Will f/u in 1 year for CPE      Generalized anxiety disorder with panic attacks -     Citalopram  Hydrobromide; Take 1.5 tablets (30 mg total) by mouth daily.  Dispense: 135 tablet; Refill: 1  Agoraphobia -     Citalopram  Hydrobromide; Take 1.5 tablets (30 mg total) by mouth daily.  Dispense: 135 tablet; Refill: 1  Grief -     Citalopram  Hydrobromide; Take 1.5 tablets (30 mg total) by mouth daily.  Dispense: 135 tablet; Refill: 1  Major depressive disorder, recurrent episode, moderate (HCC) -     Citalopram  Hydrobromide; Take 1.5 tablets (30 mg total) by mouth daily.  Dispense: 135 tablet; Refill: 1  PTSD (post-traumatic stress disorder) -     Citalopram  Hydrobromide; Take 1.5 tablets (30 mg total) by mouth daily.  Dispense: 135 tablet; Refill: 1  IFG (impaired fasting glucose) -     Hemoglobin A1c  Vitamin D  deficiency -     VITAMIN D  25 Hydroxy (Vit-D Deficiency, Fractures)  TSH (thyroid-stimulating hormone deficiency) -     TSH + free T4  Other hyperlipidemia -     Lipid panel -     CMP14+EGFR -     CBC with Differential/Platelet  Note: This chart has been completed using Engineer, civil (consulting) software, and while attempts have been made to ensure accuracy, certain  words and phrases may not be transcribed as intended.    Return in about 1 year (around 10/21/2024) for CPE.     Tamelia Michalowski, FNP

## 2023-10-22 NOTE — Patient Instructions (Signed)
 I appreciate the opportunity to provide care to you today!    Follow up:  1 year for CPE  Labs: please stop by the lab today to get your blood drawn (CBC, CMP, TSH, Lipid profile, HgA1c, Vit D)  For a Healthier YOU, I Recommend: Reducing your intake of sugar, sodium, carbohydrates, and saturated fats. Increasing your fiber intake by incorporating more whole grains, fruits, and vegetables into your meals. Setting healthy goals with a focus on lowering your consumption of carbs, sugar, and unhealthy fats. Adding variety to your diet by including a wide range of fruits and vegetables. Cutting back on soda and limiting processed foods as much as possible. Staying active: In addition to taking your weight loss medication, aim for at least 150 minutes of moderate-intensity physical activity each week for optimal results.   Please follow up if your symptoms worsen or fail to improve.    Please continue to a heart-healthy diet and increase your physical activities. Try to exercise for at least five days a week.    It was a pleasure to see you and I look forward to continuing to work together on your health and well-being. Please do not hesitate to call the office if you need care or have questions about your care.  In case of emergency, please visit the Emergency Department for urgent care, or contact our clinic at [phone] to schedule an appointment. We're here to help you!   Have a wonderful day and week. With Gratitude, Mariah Harn MSN, FNP-BC

## 2023-10-22 NOTE — Assessment & Plan Note (Signed)

## 2023-10-23 ENCOUNTER — Ambulatory Visit: Payer: Self-pay | Admitting: Family Medicine

## 2023-10-23 DIAGNOSIS — E038 Other specified hypothyroidism: Secondary | ICD-10-CM

## 2023-10-23 DIAGNOSIS — E559 Vitamin D deficiency, unspecified: Secondary | ICD-10-CM

## 2023-10-23 LAB — CMP14+EGFR
ALT: 11 IU/L (ref 0–32)
AST: 14 IU/L (ref 0–40)
Albumin: 4.6 g/dL (ref 4.0–5.0)
Alkaline Phosphatase: 75 IU/L (ref 44–121)
BUN/Creatinine Ratio: 9 (ref 9–23)
BUN: 8 mg/dL (ref 6–20)
Bilirubin Total: 1.1 mg/dL (ref 0.0–1.2)
CO2: 18 mmol/L — ABNORMAL LOW (ref 20–29)
Calcium: 9.7 mg/dL (ref 8.7–10.2)
Chloride: 104 mmol/L (ref 96–106)
Creatinine, Ser: 0.93 mg/dL (ref 0.57–1.00)
Globulin, Total: 2.3 g/dL (ref 1.5–4.5)
Glucose: 77 mg/dL (ref 70–99)
Potassium: 4.6 mmol/L (ref 3.5–5.2)
Sodium: 140 mmol/L (ref 134–144)
Total Protein: 6.9 g/dL (ref 6.0–8.5)
eGFR: 86 mL/min/1.73 (ref >59)

## 2023-10-23 LAB — CBC WITH DIFFERENTIAL/PLATELET
Basophils Absolute: 0.1 x10E3/uL (ref 0.0–0.2)
Basos: 1 %
EOS (ABSOLUTE): 0.1 x10E3/uL (ref 0.0–0.4)
Eos: 1 %
Hematocrit: 45.5 % (ref 34.0–46.6)
Hemoglobin: 14.6 g/dL (ref 11.1–15.9)
Immature Grans (Abs): 0 x10E3/uL (ref 0.0–0.1)
Immature Granulocytes: 0 %
Lymphocytes Absolute: 1.7 x10E3/uL (ref 0.7–3.1)
Lymphs: 15 %
MCH: 32 pg (ref 26.6–33.0)
MCHC: 32.1 g/dL (ref 31.5–35.7)
MCV: 100 fL — ABNORMAL HIGH (ref 79–97)
Monocytes Absolute: 0.7 x10E3/uL (ref 0.1–0.9)
Monocytes: 6 %
Neutrophils Absolute: 8.7 x10E3/uL — ABNORMAL HIGH (ref 1.4–7.0)
Neutrophils: 77 %
Platelets: 294 x10E3/uL (ref 150–450)
RBC: 4.56 x10E6/uL (ref 3.77–5.28)
RDW: 11.3 % — ABNORMAL LOW (ref 11.7–15.4)
WBC: 11.3 x10E3/uL — ABNORMAL HIGH (ref 3.4–10.8)

## 2023-10-23 LAB — LIPID PANEL
Chol/HDL Ratio: 4 ratio (ref 0.0–4.4)
Cholesterol, Total: 135 mg/dL (ref 100–199)
HDL: 34 mg/dL — ABNORMAL LOW (ref >39)
LDL Chol Calc (NIH): 89 mg/dL (ref 0–99)
Triglycerides: 56 mg/dL (ref 0–149)
VLDL Cholesterol Cal: 12 mg/dL (ref 5–40)

## 2023-10-23 LAB — HEMOGLOBIN A1C
Est. average glucose Bld gHb Est-mCnc: 91 mg/dL
Hgb A1c MFr Bld: 4.8 % (ref 4.8–5.6)

## 2023-10-23 LAB — TSH+FREE T4
Free T4: 1.32 ng/dL (ref 0.82–1.77)
TSH: 0.358 u[IU]/mL — ABNORMAL LOW (ref 0.450–4.500)

## 2023-10-23 LAB — VITAMIN D 25 HYDROXY (VIT D DEFICIENCY, FRACTURES): Vit D, 25-Hydroxy: 26.5 ng/mL — ABNORMAL LOW (ref 30.0–100.0)

## 2023-10-23 MED ORDER — VITAMIN D (ERGOCALCIFEROL) 1.25 MG (50000 UNIT) PO CAPS
50000.0000 [IU] | ORAL_CAPSULE | ORAL | 1 refills | Status: AC
  Filled 2023-10-23: qty 4, 28d supply, fill #0
  Filled 2023-11-25: qty 4, 28d supply, fill #1
  Filled 2023-12-24: qty 4, 28d supply, fill #2
  Filled 2024-01-25: qty 4, 28d supply, fill #3
  Filled 2024-04-19: qty 4, 28d supply, fill #4

## 2023-10-24 ENCOUNTER — Other Ambulatory Visit: Payer: Self-pay

## 2023-10-26 ENCOUNTER — Other Ambulatory Visit: Payer: Self-pay

## 2023-11-03 ENCOUNTER — Other Ambulatory Visit: Payer: Self-pay | Admitting: Family Medicine

## 2023-11-25 ENCOUNTER — Other Ambulatory Visit: Payer: Self-pay | Admitting: Neurology

## 2023-11-25 ENCOUNTER — Other Ambulatory Visit: Payer: Self-pay

## 2023-11-25 MED ORDER — AMPHETAMINE-DEXTROAMPHETAMINE 10 MG PO TABS
10.0000 mg | ORAL_TABLET | Freq: Two times a day (BID) | ORAL | 0 refills | Status: DC
  Filled 2023-11-25: qty 60, 30d supply, fill #0

## 2023-11-27 ENCOUNTER — Other Ambulatory Visit: Payer: Self-pay | Admitting: Obstetrics & Gynecology

## 2023-11-27 ENCOUNTER — Other Ambulatory Visit: Payer: Self-pay

## 2023-11-30 ENCOUNTER — Other Ambulatory Visit: Payer: Self-pay

## 2023-11-30 MED ORDER — VALACYCLOVIR HCL 1 G PO TABS
1000.0000 mg | ORAL_TABLET | Freq: Every day | ORAL | 11 refills | Status: AC
  Filled 2023-11-30: qty 30, 30d supply, fill #0
  Filled 2023-12-24: qty 30, 30d supply, fill #1
  Filled 2024-04-19: qty 30, 30d supply, fill #2

## 2023-12-03 ENCOUNTER — Encounter: Payer: Self-pay | Admitting: Family Medicine

## 2023-12-11 DIAGNOSIS — E038 Other specified hypothyroidism: Secondary | ICD-10-CM | POA: Diagnosis not present

## 2023-12-12 LAB — TSH+FREE T4
Free T4: 1.28 ng/dL (ref 0.82–1.77)
TSH: 0.428 u[IU]/mL — ABNORMAL LOW (ref 0.450–4.500)

## 2023-12-16 NOTE — Telephone Encounter (Signed)
 Patient is calling to ask for the clinic staff to acknowledge the patient's MyChart messages please.

## 2023-12-17 ENCOUNTER — Other Ambulatory Visit: Payer: Self-pay | Admitting: Family Medicine

## 2023-12-17 DIAGNOSIS — R41840 Attention and concentration deficit: Secondary | ICD-10-CM

## 2023-12-17 NOTE — Progress Notes (Signed)
 The patient reports that the last three times she received the flu vaccine, she experienced severe arm pain and increased itching at the injection site lasting 1-2 days, along with chills, shakes, and flu-like symptoms that persisted for about 4 days.

## 2023-12-24 ENCOUNTER — Other Ambulatory Visit: Payer: Self-pay

## 2023-12-30 ENCOUNTER — Telehealth: Payer: Self-pay

## 2023-12-30 ENCOUNTER — Other Ambulatory Visit: Payer: Self-pay | Admitting: Neurology

## 2023-12-30 ENCOUNTER — Other Ambulatory Visit: Payer: Self-pay

## 2023-12-30 MED ORDER — AMPHETAMINE-DEXTROAMPHETAMINE 10 MG PO TABS
10.0000 mg | ORAL_TABLET | Freq: Two times a day (BID) | ORAL | 0 refills | Status: AC
  Filled 2023-12-30: qty 60, 30d supply, fill #0

## 2023-12-30 NOTE — Telephone Encounter (Signed)
 I spoke with patient and got her reassigned to Dr Onita as her primary neurologist. She did advise that she is in need of a refill of her medication amphetamine -dextroamphetamine  (ADDERALL) 10 MG tablet  Since Dr Onita is out of the office, can you send a refill of this medication to  [facility]    Thank you!

## 2023-12-31 ENCOUNTER — Other Ambulatory Visit: Payer: Self-pay

## 2024-01-01 DIAGNOSIS — F331 Major depressive disorder, recurrent, moderate: Secondary | ICD-10-CM | POA: Diagnosis not present

## 2024-01-01 DIAGNOSIS — R41840 Attention and concentration deficit: Secondary | ICD-10-CM | POA: Diagnosis not present

## 2024-01-01 DIAGNOSIS — F4310 Post-traumatic stress disorder, unspecified: Secondary | ICD-10-CM | POA: Diagnosis not present

## 2024-01-01 DIAGNOSIS — F411 Generalized anxiety disorder: Secondary | ICD-10-CM | POA: Diagnosis not present

## 2024-01-01 DIAGNOSIS — G4700 Insomnia, unspecified: Secondary | ICD-10-CM | POA: Diagnosis not present

## 2024-01-18 ENCOUNTER — Ambulatory Visit: Admitting: Neurology

## 2024-01-18 ENCOUNTER — Encounter: Payer: Self-pay | Admitting: Neurology

## 2024-01-18 ENCOUNTER — Other Ambulatory Visit: Payer: Self-pay

## 2024-01-18 VITALS — BP 128/83 | HR 68 | Ht 62.0 in | Wt 132.0 lb

## 2024-01-18 DIAGNOSIS — G43009 Migraine without aura, not intractable, without status migrainosus: Secondary | ICD-10-CM

## 2024-01-18 DIAGNOSIS — M542 Cervicalgia: Secondary | ICD-10-CM

## 2024-01-18 MED ORDER — NORTRIPTYLINE HCL 10 MG PO CAPS
20.0000 mg | ORAL_CAPSULE | Freq: Every day | ORAL | 11 refills | Status: AC
  Filled 2024-01-18: qty 60, 30d supply, fill #0

## 2024-01-18 MED ORDER — TIZANIDINE HCL 4 MG PO TABS
4.0000 mg | ORAL_TABLET | Freq: Four times a day (QID) | ORAL | 6 refills | Status: AC | PRN
  Filled 2024-01-18: qty 30, 8d supply, fill #0

## 2024-01-18 NOTE — Progress Notes (Signed)
 Chief Complaint  Patient presents with   RM15/MIGRAINES    Pt is here Alone. Pt states that she has 2-3 migraines per month lasting 2 days.       ASSESSMENT AND PLAN  Karen Moss is a 28 y.o. female   Chronic migraine Left occipital area pain  Add on low-dose nortriptyline 10 mg titrating to 20 mg every night as migraine prevention, keep Celexa  30 mg every morning  Nurtec as needed, may combine with Aleve , tizanidine, Zofran  for severe prolonged headaches,  She has significant tenderness at left nuchal area, suggest warm compression, massage, neck stretching exercise  DIAGNOSTIC DATA (LABS, IMAGING, TESTING) - I reviewed patient records, labs, notes, testing and imaging myself where available.   MEDICAL HISTORY:  Karen Moss is a 28 year old female, follow-up for migraine headaches, her primary care physician is from [place] nurse practitioner Edman Das.  History is obtained from the patient and review of electronic medical records. I personally reviewed pertinent available imaging films in PACS.   PMHx of  Chronic migraine Has been on contraceptive, change every 3 years Nexplanon  since 2012 Depression, on Celexa   She started to have migraine around 2021 with stress, typical migraine tends to start on the left occipital region, shooting forward, with light, noise sensitivity, nauseous, lasting for few hours, sometimes radiating deep achy pain to left shoulder,  She used to have 3-4 migraines in a week, eventually improved after starting taking Qulipta  60 mg daily,  She has tried triptan without helping her headache much, not taking Nurtec as needed, which does help her headache, however if she did not catch her headache early on with Nurtec, headache can last 2 to 3 days,  Trigger for her migraine are weather change, stress, sleep deprivation, missing the meal, dehydration,  She is under a lot of stress, attending nursing school, working full-time,  has full custody of her 99 years old nephew  She has pending appointment with psychiatrist for managing her depression anxiety, and Adderall  Had extensive imaging study in the past, MRI of the brain with and without contrast January 14, 2023 was normal MRI of cervical spine with without contrast showed no significant abnormality  MRI of lumbar spine was normal MRI angiogram head without contrast showed no large vessel disease Labs from September 2025, mild decreased TSH, hemoglobin 14.6, normal CMP, LDL 89, mild decreased vitamin D  26.5, A1c 4.8  PHYSICAL EXAM:   Vitals:   01/18/24 1312  BP: 128/83  Pulse: 68  SpO2: 98%  Weight: 132 lb (59.9 kg)  Height: 5' 2 (1.575 m)     Body mass index is 24.14 kg/m.  PHYSICAL EXAMNIATION:  Gen: NAD, conversant, well nourised, well groomed                     Cardiovascular: Regular rate rhythm, no peripheral edema, warm, nontender. Eyes: Conjunctivae clear without exudates or hemorrhage Neck: Supple, no carotid bruits. Pulmonary: Clear to auscultation bilaterally   NEUROLOGICAL EXAM:  MENTAL STATUS: Speech/cognition: Awake, alert, oriented to history taking and casual conversation CRANIAL NERVES: CN II: Visual fields are full to confrontation. Pupils are round equal and briskly reactive to light. CN III, IV, VI: extraocular movement are normal. No ptosis. CN V: Facial sensation is intact to light touch CN VII: Face is symmetric with normal eye closure  CN VIII: Hearing is normal to causal conversation. CN IX, X: Phonation is normal. CN XI: Head turning and shoulder shrug are intact  MOTOR: There is no pronator drift of out-stretched arms. Muscle bulk and tone are normal. Muscle strength is normal.  REFLEXES: Reflexes are 2+ and symmetric at the biceps, triceps, knees, and ankles. Plantar responses are flexor.  SENSORY: Intact to light touch, pinprick and vibratory sensation are intact in fingers and  toes.  COORDINATION: There is no trunk or limb dysmetria noted.  GAIT/STANCE: Posture is normal. Gait is steady    REVIEW OF SYSTEMS:  Full 14 system review of systems performed and notable only for as above All other review of systems were negative.   ALLERGIES: Allergies  Allergen Reactions   Tangerine Flavoring Agent (Non-Screening) Itching and Rash    HOME MEDICATIONS: Current Outpatient Medications  Medication Sig Dispense Refill   amphetamine -dextroamphetamine  (ADDERALL) 10 MG tablet Take 1 tablet (10 mg total) by mouth 2 (two) times daily with a meal. 60 tablet 0   Atogepant  (QULIPTA ) 60 MG TABS Take 1 tablet (60 mg total) by mouth daily. 30 tablet 11   citalopram  (CELEXA ) 20 MG tablet Take 1.5 tablets (30 mg total) by mouth daily. 135 tablet 1   etonogestrel  (NEXPLANON ) 68 MG IMPL implant Inject 1 each into the skin once.     ondansetron  (ZOFRAN -ODT) 8 MG disintegrating tablet Take 1 tablet (8 mg total) by mouth every 8 (eight) hours as needed for nausea or vomiting. 24 tablet 0   propranolol  (INDERAL ) 10 MG tablet Take 1 tablet (10 mg total) by mouth 3 (three) times daily. 180 tablet 1   Rimegepant Sulfate  (NURTEC) 75 MG TBDP Disolve 1 tablet (75 mg total) on the tongue once daily if needed. Take as close to onset of migraine as possible. 16 tablet 10   valACYclovir  (VALTREX ) 1000 MG tablet Take 1 tablet (1,000 mg total) by mouth daily. 30 tablet 11   Vitamin D , Ergocalciferol , (DRISDOL ) 1.25 MG (50000 UNIT) CAPS capsule Take 1 capsule (50,000 Units total) by mouth every 7 (seven) days. 27 capsule 1   megestrol  (MEGACE ) 40 MG tablet TAKE 1 TABLET BY MOUTH ONCE DAILY for spotting 60 tablet 1   Rimegepant Sulfate  (NURTEC) 75 MG TBDP Take 1 tablet (75 mg total) by mouth daily as needed. For migraines. Take as close to onset of migraine as possible. One daily maximum. 16 tablet 11   No current facility-administered medications for this visit.    PAST MEDICAL HISTORY: Past  Medical History:  Diagnosis Date   ASCUS with positive high risk HPV cervical 03/13/2021   03/13/21 will get colpo per [facility]  5 year CIN 3+ risk is 5.4 %   Chlamydia 12/30/2016   Medical history non-contributory    Migraine    Papanicolaou smear of cervix with positive high risk human papilloma virus (HPV) test 03/06/2020   02/2020 repeat in 1 year with HVP testing, [per [facility] 5 year risk CIN3 + 4.8 %   Wax in ear 02/16/2018    PAST SURGICAL HISTORY: Past Surgical History:  Procedure Laterality Date   REFRACTIVE SURGERY Bilateral    Lasik eye surgery    FAMILY HISTORY: Family History  Problem Relation Age of Onset   Anxiety disorder Mother    Depression Mother    Hypertension Mother    Stroke Mother    Post-traumatic stress disorder Mother    Heart disease Father    Cancer Father 35       prostate   Cancer Maternal Grandfather        brain   Cancer Maternal Grandmother  lung    SOCIAL HISTORY: Social History   Socioeconomic History   Marital status: Single    Spouse name: Not on file   Number of children: Not on file   Years of education: Not on file   Highest education level: 12th grade  Occupational History   Not on file  Tobacco Use   Smoking status: Never   Smokeless tobacco: Never  Vaping Use   Vaping status: Never Used  Substance and Sexual Activity   Alcohol use: Not Currently    Comment: Last drink in March.  See psychiatry note from 01/23/2023   Drug use: No   Sexual activity: Yes    Birth control/protection: Implant  Other Topics Concern   Not on file  Social History Narrative   Not on file   Social Drivers of Health   Financial Resource Strain: Low Risk  (10/21/2023)   Overall Financial Resource Strain (CARDIA)    Difficulty of Paying Living Expenses: Not hard at all  Food Insecurity: No Food Insecurity (10/21/2023)   Hunger Vital Sign    Worried About Running Out of Food in the Last Year: Never true    Ran Out of Food in the Last  Year: Never true  Transportation Needs: No Transportation Needs (10/21/2023)   PRAPARE - Administrator, Civil Service (Medical): No    Lack of Transportation (Non-Medical): No  Physical Activity: Insufficiently Active (10/21/2023)   Exercise Vital Sign    Days of Exercise per Week: 3 days    Minutes of Exercise per Session: 30 min  Stress: Stress Concern Present (10/21/2023)   [employer] of Occupational Health - Occupational Stress Questionnaire    Feeling of Stress: To some extent  Social Connections: Moderately Isolated (10/21/2023)   Social Connection and Isolation Panel    Frequency of Communication with Friends and Family: More than three times a week    Frequency of Social Gatherings with Friends and Family: Once a week    Attends Religious Services: More than 4 times per year    Active Member of [employer] or Organizations: No    Attends Banker Meetings: Not on file    Marital Status: Never married  Intimate Partner Violence: Not At Risk (04/20/2023)   Humiliation, Afraid, Rape, and Kick questionnaire    Fear of Current or Ex-Partner: No    Emotionally Abused: No    Physically Abused: No    Sexually Abused: No      Modena Callander, M.D. Ph.D.  [facility] [address], [address] [place], [place] [address] Ph: [phone] Fax: [phone]  CC:  Edman Meade PEDLAR, FNP [address] [place],  [place] [address]  Edman Meade PEDLAR, FNP

## 2024-01-18 NOTE — Patient Instructions (Signed)
 Meds ordered this encounter  Medications   tiZANidine (ZANAFLEX) 4 MG tablet    Sig: Take 1 tablet (4 mg total) by mouth every 6 (six) hours as needed.    Dispense:  30 tablet    Refill:  6     Zofran , Nurtec, aleve  prn for severe migraine headaches

## 2024-01-19 ENCOUNTER — Other Ambulatory Visit: Payer: Self-pay

## 2024-01-25 ENCOUNTER — Other Ambulatory Visit: Payer: Self-pay

## 2024-01-26 ENCOUNTER — Other Ambulatory Visit: Payer: Self-pay

## 2024-01-27 ENCOUNTER — Other Ambulatory Visit: Payer: Self-pay

## 2024-01-27 DIAGNOSIS — Z79899 Other long term (current) drug therapy: Secondary | ICD-10-CM | POA: Diagnosis not present

## 2024-01-27 DIAGNOSIS — R41840 Attention and concentration deficit: Secondary | ICD-10-CM | POA: Diagnosis not present

## 2024-02-05 ENCOUNTER — Other Ambulatory Visit: Payer: Self-pay

## 2024-02-05 ENCOUNTER — Other Ambulatory Visit (HOSPITAL_COMMUNITY): Payer: Self-pay

## 2024-02-05 DIAGNOSIS — Z79899 Other long term (current) drug therapy: Secondary | ICD-10-CM | POA: Diagnosis not present

## 2024-02-05 DIAGNOSIS — R41840 Attention and concentration deficit: Secondary | ICD-10-CM | POA: Diagnosis not present

## 2024-02-05 MED ORDER — LISDEXAMFETAMINE DIMESYLATE 20 MG PO CAPS
20.0000 mg | ORAL_CAPSULE | Freq: Every morning | ORAL | 0 refills | Status: DC
  Filled 2024-02-05: qty 30, 30d supply, fill #0

## 2024-02-08 DIAGNOSIS — Z79899 Other long term (current) drug therapy: Secondary | ICD-10-CM | POA: Diagnosis not present

## 2024-02-08 DIAGNOSIS — R41840 Attention and concentration deficit: Secondary | ICD-10-CM | POA: Diagnosis not present

## 2024-03-11 ENCOUNTER — Other Ambulatory Visit: Payer: Self-pay

## 2024-03-11 MED ORDER — LISDEXAMFETAMINE DIMESYLATE 20 MG PO CAPS
20.0000 mg | ORAL_CAPSULE | Freq: Every morning | ORAL | 0 refills | Status: AC
  Filled 2024-03-11: qty 30, 30d supply, fill #0

## 2024-03-21 ENCOUNTER — Other Ambulatory Visit: Payer: Self-pay

## 2024-03-22 ENCOUNTER — Ambulatory Visit: Admitting: Neurology

## 2024-03-23 ENCOUNTER — Other Ambulatory Visit: Payer: Self-pay

## 2024-03-23 MED ORDER — LISDEXAMFETAMINE DIMESYLATE 10 MG PO CAPS
10.0000 mg | ORAL_CAPSULE | Freq: Every day | ORAL | 0 refills | Status: AC
  Filled 2024-03-23: qty 30, 30d supply, fill #0

## 2024-04-15 ENCOUNTER — Other Ambulatory Visit: Payer: Self-pay

## 2024-04-21 ENCOUNTER — Other Ambulatory Visit: Payer: Self-pay

## 2024-10-28 ENCOUNTER — Encounter: Admitting: Family Medicine
